# Patient Record
Sex: Male | Born: 1980 | Race: Black or African American | Hispanic: No | Marital: Married | Smoking: Former smoker
Health system: Southern US, Community
[De-identification: ages and names within clinical notes are randomized; demographics above are authoritative.]

## PROBLEM LIST (undated history)

## (undated) DIAGNOSIS — I10 Essential (primary) hypertension: Secondary | ICD-10-CM

## (undated) DIAGNOSIS — N200 Calculus of kidney: Secondary | ICD-10-CM

## (undated) DIAGNOSIS — R3129 Other microscopic hematuria: Secondary | ICD-10-CM

## (undated) HISTORY — PX: NO PAST SURGERIES: SHX2092

## (undated) HISTORY — PX: OTHER SURGICAL HISTORY: SHX169

## (undated) HISTORY — DX: Other microscopic hematuria: R31.29

---

## 2005-02-12 ENCOUNTER — Emergency Department: Payer: Self-pay | Admitting: Emergency Medicine

## 2005-03-25 ENCOUNTER — Emergency Department: Payer: Self-pay | Admitting: Emergency Medicine

## 2007-06-13 IMAGING — CT CT ABD-PELV W/O CM
1 of 2 series · 15 of 32 positions shown, 19 images · non-contrast
Comparison: none

REASON FOR EXAM: Stone study
COMMENTS:

[Series 2: stone · axial · 0.62mm/px · z∈[-528,-148]mm · 15 of 144 slices shown, 19 images]
[im 11/144  soft-tissue]
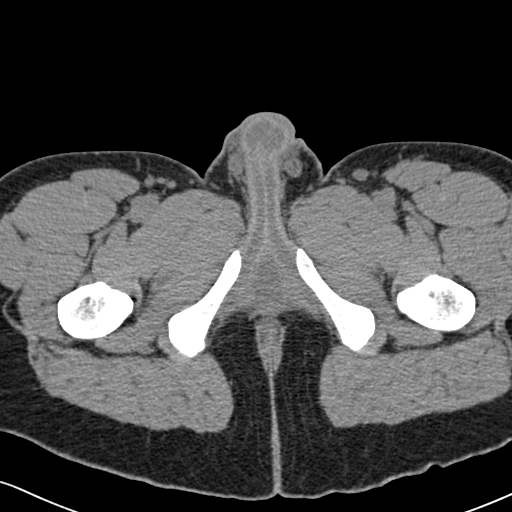
[im 11/144  bone]
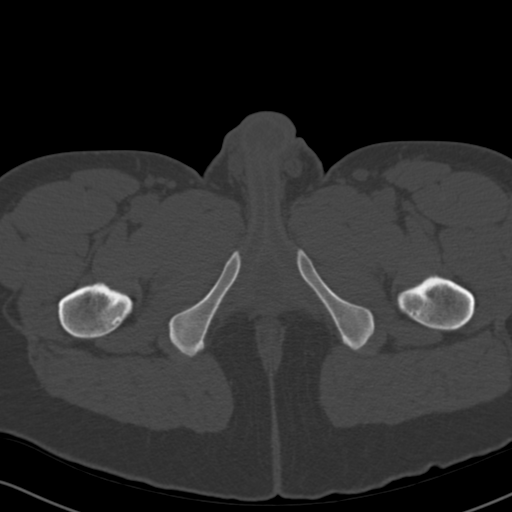
[im 22/144  soft-tissue]
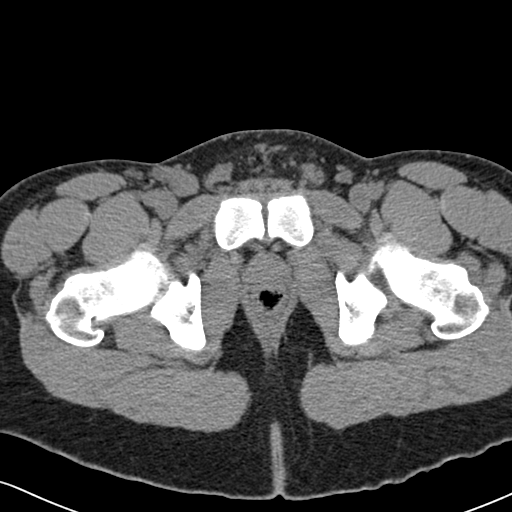
[im 32/144  soft-tissue]
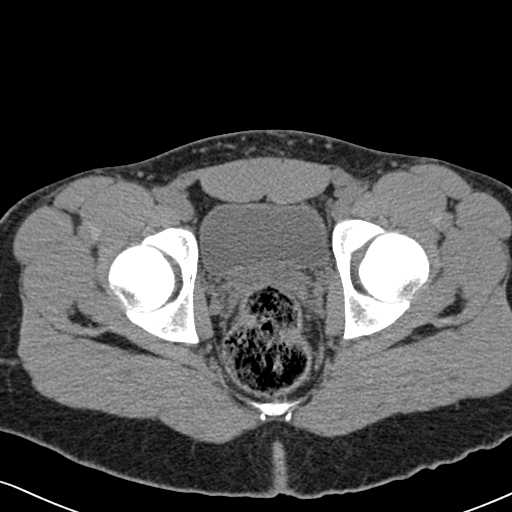
[im 43/144  soft-tissue]
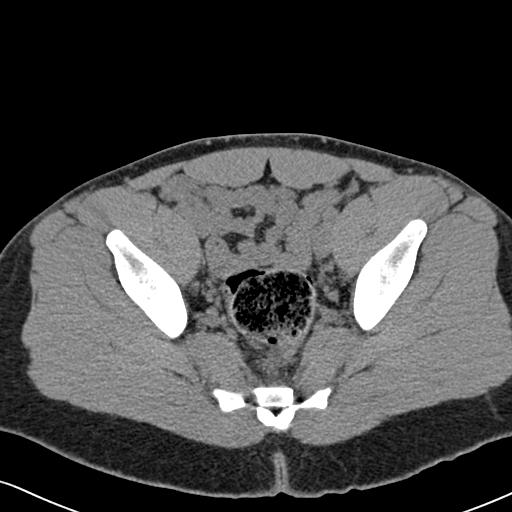
[im 53/144  soft-tissue]
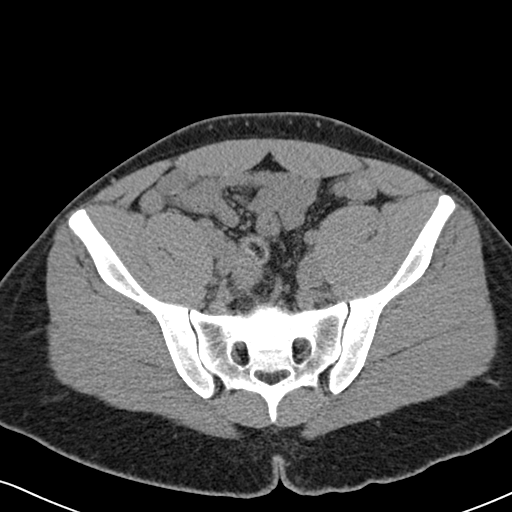
[im 64/144  soft-tissue]
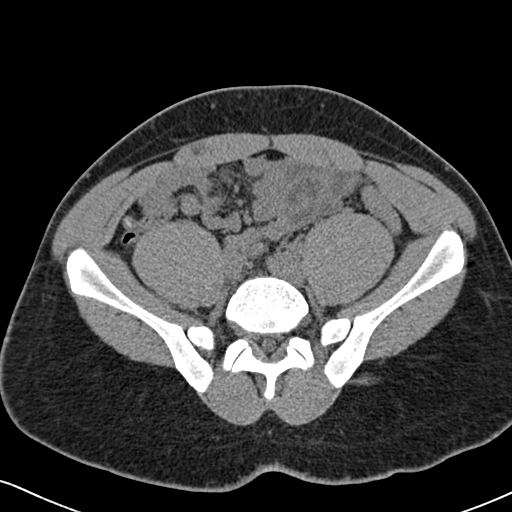
[im 75/144  soft-tissue]
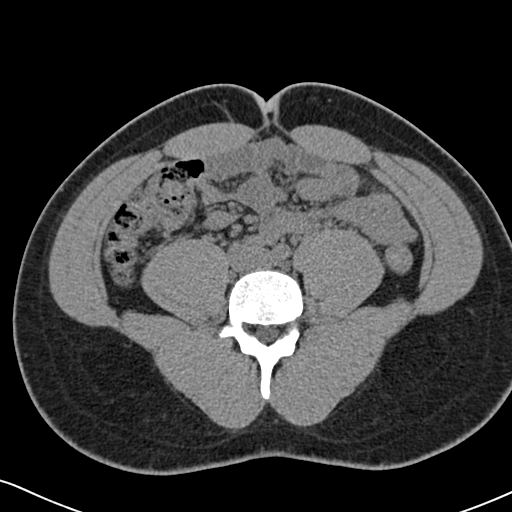
[im 85/144  soft-tissue]
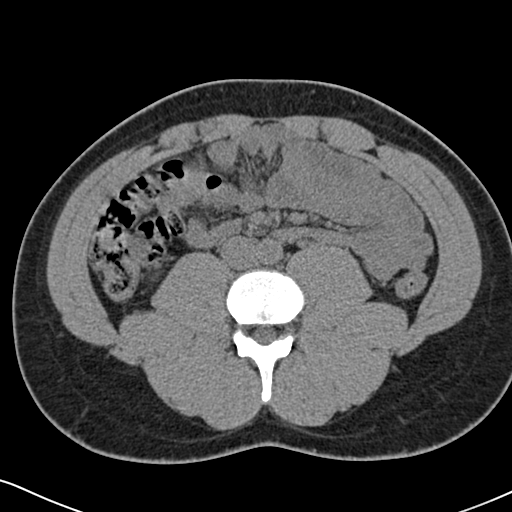
[im 96/144  soft-tissue]
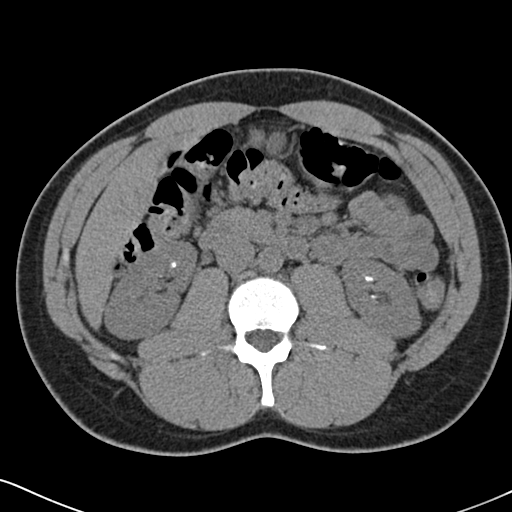
[im 96/144  bone]
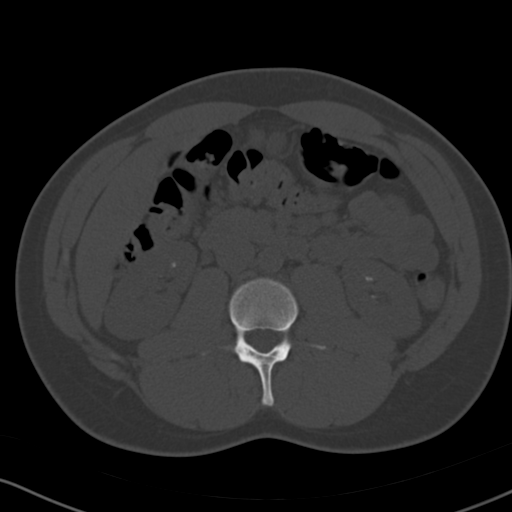
[im 106/144  soft-tissue]
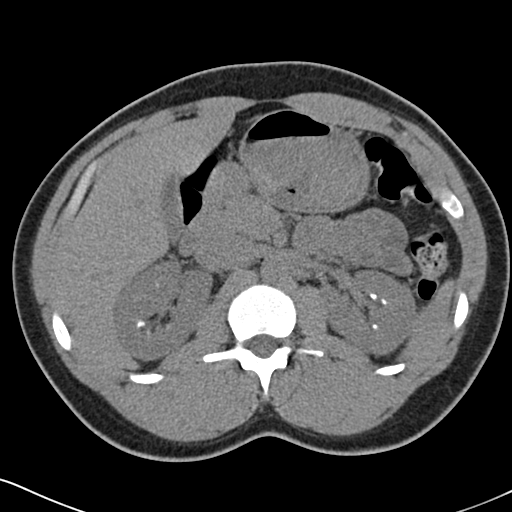
[im 117/144  soft-tissue]
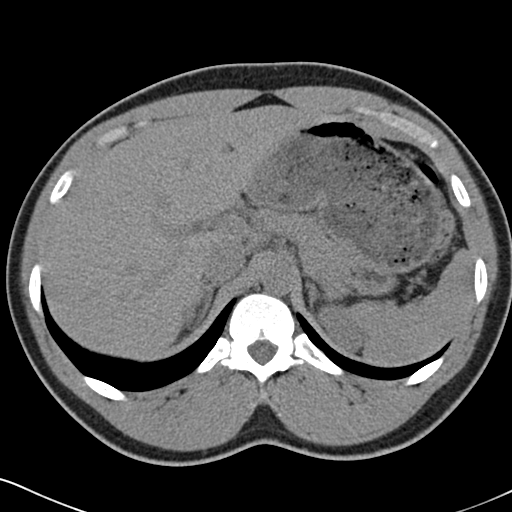
[im 122/144  lung]
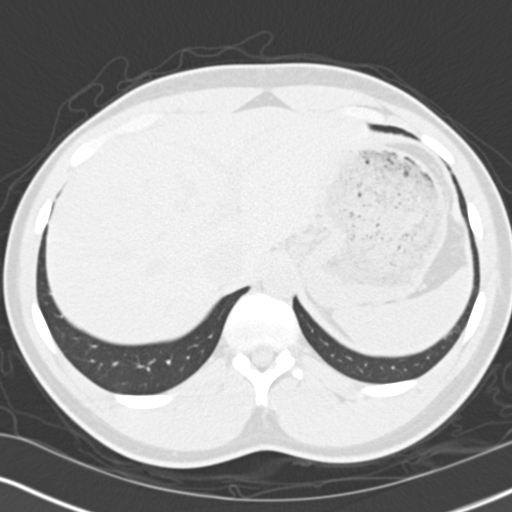
[im 128/144  soft-tissue]
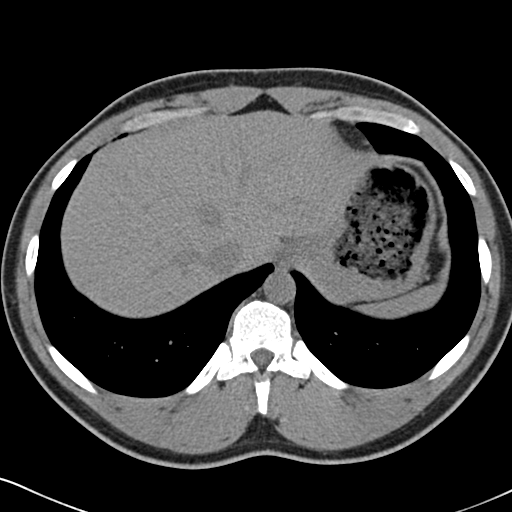
[im 128/144  lung]
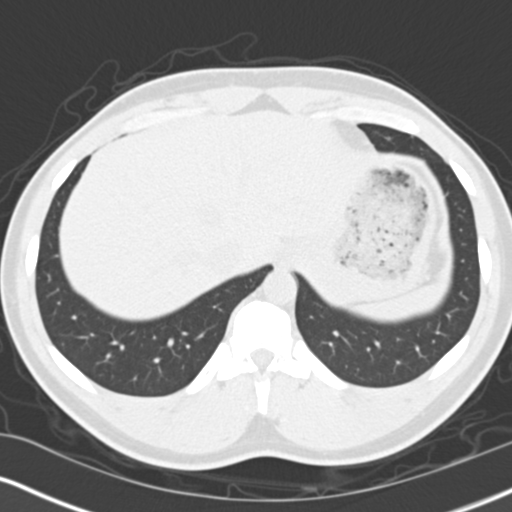
[im 133/144  lung]
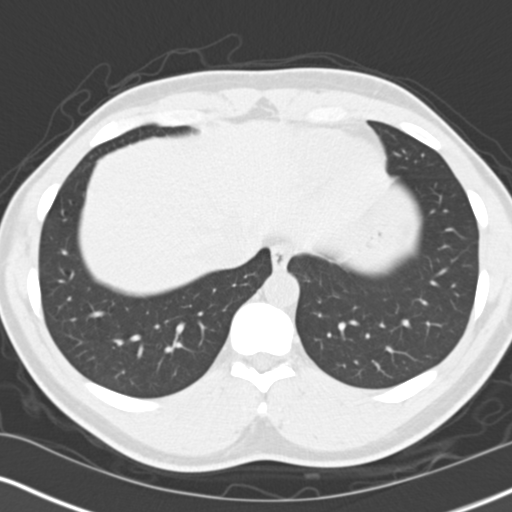
[im 138/144  soft-tissue]
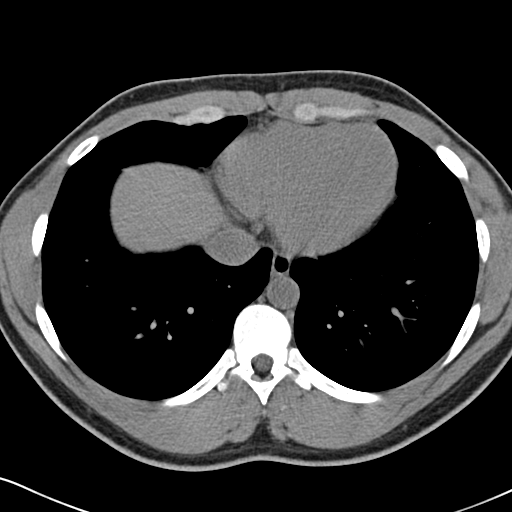
[im 138/144  lung]
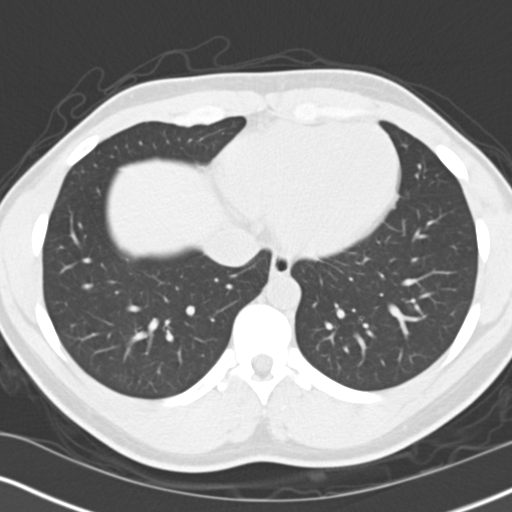

[15 of 32 positions shown; findings below may reference images not displayed]

PROCEDURE:     CT  - CT ABDOMEN AND PELVIS W[DATE] [DATE]

RESULT:     Spiral 3 mm sections were obtained from the lung bases through
the pubic symphysis without the administration of oral or intravenous
contrast.

Evaluation of the lung bases demonstrates no evidence of infiltrates,
effusions, or edema.

Evaluation of the RIGHT and LEFT kidneys demonstrates multiple bilateral
nonobstructing calculi measuring approximately 4 mm in diameter.  There is
no evidence of hydronephrosis, nor hydroureter, nor ureterolithiasis.
Evaluation of the remaining solid abdominal viscera demonstrates no gross
abnormalities.  There is no evidence of abdominal or pelvic free fluid or
drainable loculated fluid collections.  Adenopathy is demonstrated within
the RIGHT and LEFT inguinal regions, with the largest lymph node in the
RIGHT inguinal region measuring approximately 1.2 cm in short axis.
IMPRESSION: 1.     Bilateral nonobstructing nephrolithiasis.
2.     Bilateral inguinal adenopathy.
3.     A  preliminary report was rendered by Dr. Hortencia Kohli, of [REDACTED], on 02/13/2005 at [DATE] a.m. and relayed to Dr. Yemisi
Adonis, of the emergency department, at that time via FAX.

## 2008-10-23 ENCOUNTER — Emergency Department: Payer: Self-pay | Admitting: Emergency Medicine

## 2013-07-22 ENCOUNTER — Emergency Department: Payer: Self-pay | Admitting: Emergency Medicine

## 2015-04-20 DIAGNOSIS — R109 Unspecified abdominal pain: Secondary | ICD-10-CM | POA: Diagnosis present

## 2015-04-20 DIAGNOSIS — N200 Calculus of kidney: Secondary | ICD-10-CM | POA: Insufficient documentation

## 2015-04-21 ENCOUNTER — Encounter: Payer: Self-pay | Admitting: *Deleted

## 2015-04-21 ENCOUNTER — Emergency Department
Admission: EM | Admit: 2015-04-21 | Discharge: 2015-04-21 | Disposition: A | Discharge status: Home / Self Care | Payer: 59 | Attending: Emergency Medicine | Admitting: Emergency Medicine

## 2015-04-21 DIAGNOSIS — N200 Calculus of kidney: Secondary | ICD-10-CM

## 2015-04-21 HISTORY — DX: Calculus of kidney: N20.0

## 2015-04-21 LAB — URINALYSIS COMPLETE WITH MICROSCOPIC (ARMC ONLY)
Bacteria, UA: NONE SEEN
Bilirubin Urine: NEGATIVE
Glucose, UA: NEGATIVE mg/dL
Ketones, ur: NEGATIVE mg/dL
Leukocytes, UA: NEGATIVE
Nitrite: NEGATIVE
Protein, ur: 100 mg/dL — AB
Specific Gravity, Urine: 1.02 (ref 1.005–1.030)
Squamous Epithelial / HPF: NONE SEEN
pH: 6 (ref 5.0–8.0)

## 2015-04-21 LAB — CBC
HCT: 48.2 % (ref 40.0–52.0)
HEMOGLOBIN: 15.8 g/dL (ref 13.0–18.0)
MCH: 30 pg (ref 26.0–34.0)
MCHC: 32.8 g/dL (ref 32.0–36.0)
MCV: 91.4 fL (ref 80.0–100.0)
PLATELETS: 179 10*3/uL (ref 150–440)
RBC: 5.27 MIL/uL (ref 4.40–5.90)
RDW: 13.1 % (ref 11.5–14.5)
WBC: 8.4 10*3/uL (ref 3.8–10.6)

## 2015-04-21 LAB — BASIC METABOLIC PANEL
Anion gap: 7 (ref 5–15)
BUN: 17 mg/dL (ref 6–20)
CHLORIDE: 106 mmol/L (ref 101–111)
CO2: 28 mmol/L (ref 22–32)
Calcium: 9.3 mg/dL (ref 8.9–10.3)
Creatinine, Ser: 1.54 mg/dL — ABNORMAL HIGH (ref 0.61–1.24)
GFR calc Af Amer: >60 mL/min (ref >60)
GFR calc non Af Amer: 58 mL/min — ABNORMAL LOW (ref >60)
GLUCOSE: 119 mg/dL — AB (ref 65–99)
POTASSIUM: 4.6 mmol/L (ref 3.5–5.1)
Sodium: 141 mmol/L (ref 135–145)

## 2015-04-21 MED ORDER — ONDANSETRON HCL 4 MG PO TABS: 4.0000 mg | ORAL_TABLET | Freq: Three times a day (TID) | ORAL | Status: DC | PRN

## 2015-04-21 MED ORDER — TAMSULOSIN HCL 0.4 MG PO CAPS
0.4000 mg | ORAL_CAPSULE | Freq: Every day | ORAL | Status: DC
  Administered 2015-04-21: 0.4 mg via ORAL
  Filled 2015-04-21: qty 1

## 2015-04-21 MED ORDER — TAMSULOSIN HCL 0.4 MG PO CAPS: 0.4000 mg | ORAL_CAPSULE | Freq: Every day | ORAL | Status: DC

## 2015-04-21 MED ORDER — ONDANSETRON HCL 4 MG/2ML IJ SOLN
4.0000 mg | Freq: Once | INTRAMUSCULAR | Status: AC
  Administered 2015-04-21: 4 mg via INTRAVENOUS
  Filled 2015-04-21: qty 2

## 2015-04-21 MED ORDER — FENTANYL CITRATE (PF) 100 MCG/2ML IJ SOLN
50.0000 ug | Freq: Once | INTRAMUSCULAR | Status: AC
  Administered 2015-04-21: 50 ug via INTRAVENOUS
  Filled 2015-04-21: qty 2

## 2015-04-21 MED ORDER — OXYCODONE-ACETAMINOPHEN 5-325 MG PO TABS: 1.0000 | ORAL_TABLET | ORAL | Status: DC | PRN

## 2015-04-21 MED ORDER — IBUPROFEN 600 MG PO TABS
600.0000 mg | ORAL_TABLET | Freq: Once | ORAL | Status: AC
  Administered 2015-04-21: 600 mg via ORAL
  Filled 2015-04-21: qty 1

## 2015-04-21 NOTE — ED Notes (Signed)
Patient with no complaints at this time. Respirations even and unlabored. Skin warm/dry. Discharge instructions reviewed with patient at this time. Patient given opportunity to voice concerns/ask questions. IV removed per policy and band-aid applied to site. Patient discharged at this time and left Emergency Department with steady gait.  

## 2015-04-21 NOTE — ED Notes (Signed)
Pt c/o L flank pain since 1500 yesterday. Pt c/o vomiting x 3 since pain started. Pt has hx of kidney stones but states the pain has never been this intense.

## 2015-04-21 NOTE — Discharge Instructions (Signed)
You're being treated presumptively for kidney stone based on your symptoms and blood in your urine. We discussed no CT at this point time. If you have not passed the stone and are still having symptoms after 1 week, you do need to see a urologist and you're referred to Dr. Berneice Heinrich.  Return to the emergency department for any worsening condition including worsening pain, fever,vomiting and cannot keep medications down, inability to urinate, or any other symptoms concerning to you.   Kidney Stones Kidney stones (urolithiasis) are solid masses that form inside your kidneys. The intense pain is caused by the stone moving through the kidney, ureter, bladder, and urethra (urinary tract). When the stone moves, the ureter starts to spasm around the stone. The stone is usually passed in your pee (urine).  HOME CARE  Drink enough fluids to keep your pee clear or pale yellow. This helps to get the stone out.  Strain all pee through the provided strainer. Do not pee without peeing through the strainer, not even once. If you pee the stone out, catch it in the strainer. The stone may be as small as a grain of salt. Take this to your doctor. This will help your doctor figure out what you can do to try to prevent more kidney stones.  Only take medicine as told by your doctor.  Follow up with your doctor as told.  Get follow-up X-rays as told by your doctor. GET HELP IF: You have pain that gets worse even if you have been taking pain medicine. GET HELP RIGHT AWAY IF:   Your pain does not get better with medicine.  You have a fever or shaking chills.  Your pain increases and gets worse over 18 hours.  You have new belly (abdominal) pain.  You feel faint or pass out.  You are unable to pee. MAKE SURE YOU:   Understand these instructions.  Will watch your condition.  Will get help right away if you are not doing well or get worse. Document Released: 12/31/2007 Document Revised: 03/16/2013 Document  Reviewed: 12/15/2012 [facility] Patient Information 2015 [place], [place]. This information is not intended to replace advice given to you by your health care provider. Make sure you discuss any questions you have with your health care provider.

## 2015-04-21 NOTE — ED Provider Notes (Signed)
[facility] Emergency Department Provider Note   ____________________________________________  Time seen:  I have reviewed the triage vital signs and the triage nursing note.  HISTORY  Chief Complaint Flank Pain   Historian Patient  HPI Cameron Freeman is a 34 y.o. male who has a history of prior kidney stones dating back  To 34 years old. Patient has had several daysof mild symptoms of left-sided flank pain. Acute worsening severe left flank pain with nausea and vomiting since around 3 PM yesterday. Symptoms were more severe than prior kidney stones.pain was severe, at present is mild. He been told previously of these were calcium stones.    Past Medical History  Diagnosis Date  . Kidney stones     There are no active problems to display for this patient.   History reviewed. No pertinent past surgical history.  Current Outpatient Rx  Name  Route  Sig  Dispense  Refill  . ondansetron (ZOFRAN) 4 MG tablet   Oral   Take 1 tablet (4 mg total) by mouth every 8 (eight) hours as needed for nausea or vomiting.   10 tablet   1   . oxyCODONE-acetaminophen (ROXICET) 5-325 MG per tablet   Oral   Take 1 tablet by mouth every 4 (four) hours as needed for severe pain.   10 tablet   0   . tamsulosin (FLOMAX) 0.4 MG CAPS capsule   Oral   Take 1 capsule (0.4 mg total) by mouth daily.   7 capsule   0     Allergies Review of patient's allergies indicates no known allergies.  History reviewed. No pertinent family history.  Social History Social History  Substance Use Topics  . Smoking status: Never Smoker   . Smokeless tobacco: Never Used  . Alcohol Use: Yes    Review of Systems  Constitutional: Negative for fever. Eyes: Negative for visual changes. ENT: Negative for sore throat. Cardiovascular: Negative for chest pain. Respiratory: Negative for shortness of breath. Gastrointestinal: Negative for diarrhea. Genitourinary: Negative for  hematuria Musculoskeletal: Negative for back pain. Skin: Negative for rash. Neurological: Negative for headache. 10 point Review of Systems otherwise negative ____________________________________________   PHYSICAL EXAM:  VITAL SIGNS: ED Triage Vitals  Enc Vitals Group     BP 04/21/15 0022 157/100 mmHg     Pulse Rate 04/21/15 0022 64     Resp 04/21/15 0022 20     Temp 04/21/15 0022 99.6 F (37.6 C)     Temp Source 04/21/15 0022 Oral     SpO2 04/21/15 0022 98 %     Weight 04/21/15 0022 205 lb (92.987 kg)     Height 04/21/15 0022  (1.753 m)     Head Cir --      Peak Flow --      Pain Score 04/21/15 0023 10     Pain Loc --      Pain Edu? --      Excl. in GC? --      Constitutional: Alert and oriented. Well appearing and in no distress. Eyes: Conjunctivae are normal. PERRL. Normal extraocular movements. ENT   Head: Normocephalic and atraumatic.   Nose: No congestion/rhinnorhea.   Mouth/Throat: Mucous membranes are moist.   Neck: No stridor. Cardiovascular/Chest: Normal rate, regular rhythm.  No murmurs, rubs, or gallops. Respiratory: Normal respiratory effort without tachypnea nor retractions. Breath sounds are clear and equal bilaterally. No wheezes/rales/rhonchi. Gastrointestinal: Soft. No distention, no guarding, no rebound. Nontender. No CVA tenderness.  Genitourinary/rectal:Deferred Musculoskeletal: Nontender with normal range of motion in all extremities. No joint effusions.  No lower extremity tenderness.  No edema. Neurologic:  Normal speech and language. No gross or focal neurologic deficits are appreciated. Skin:  Skin is warm, dry and intact. No rash noted. Psychiatric: Mood and affect are normal. Speech and behavior are normal. Patient exhibits appropriate insight and judgment.  ____________________________________________   EKG I, Governor Rooks, MD, the attending physician have personally viewed and interpreted all ECGs.  No EKG  performed ____________________________________________  LABS (pertinent positives/negatives)  Basic metabolic panel significant for creatinine 1.54. BUN is 17. No prior for comparison Urinalysis too numerous to count red blood cells, 6-30 white blood cells, negative leukocytes, negative nitrites, negative bacteria White blood cell count normal at 8.4, hemoglobin 15.8 and platelet count 179  ____________________________________________  RADIOLOGY All Xrays were viewed by me. Imaging interpreted by Radiologist.  none __________________________________________  PROCEDURES  Procedure(s) performed: None  Critical Care performed: None  ____________________________________________   ED COURSE / ASSESSMENT AND PLAN  CONSULTATIONS: None  Pertinent labs & imaging results that were available during my care of the patient were reviewed by me and considered in my medical decision making (see chart for details).   Patient received fentanyl for pain which resolved his pain significantly. No evidence of UTI. Too numerous to count red blood cells consistent with likely ureterolithiasis clinically. I discussed with him not obtaining CT scan due to radiation risk, and treating presumptively as kidney stone. We have no prior creatinine with which to compare, and I have let him know he should have this followed up.   Patient / Family / Caregiver informed of clinical course, medical decision-making process, and agree with plan.   I discussed return precautions, follow-up instructions, and discharged instructions with patient and/or family.  ___________________________________________   FINAL CLINICAL IMPRESSION(S) / ED DIAGNOSES   Final diagnoses:  Kidney stone       Governor Rooks, MD 04/21/15 [id number]

## 2015-04-23 LAB — URINE CULTURE: CULTURE: NO GROWTH

## 2015-08-23 ENCOUNTER — Encounter: Payer: Self-pay | Admitting: Family Medicine

## 2015-08-27 ENCOUNTER — Other Ambulatory Visit: Payer: Self-pay | Admitting: Family Medicine

## 2015-08-27 DIAGNOSIS — N200 Calculus of kidney: Secondary | ICD-10-CM

## 2015-08-30 ENCOUNTER — Encounter: Payer: Self-pay | Admitting: Urology

## 2015-08-30 ENCOUNTER — Other Ambulatory Visit: Payer: Self-pay

## 2015-08-30 ENCOUNTER — Ambulatory Visit (INDEPENDENT_AMBULATORY_CARE_PROVIDER_SITE_OTHER): Payer: 59 | Admitting: Urology

## 2015-08-30 VITALS — BP 151/89 | HR 82 | Ht 69.0 in | Wt 208.0 lb

## 2015-08-30 DIAGNOSIS — R3129 Other microscopic hematuria: Secondary | ICD-10-CM | POA: Diagnosis not present

## 2015-08-30 DIAGNOSIS — Z87442 Personal history of urinary calculi: Secondary | ICD-10-CM | POA: Diagnosis not present

## 2015-08-30 LAB — URINALYSIS, COMPLETE
BILIRUBIN UA: NEGATIVE
GLUCOSE, UA: NEGATIVE
KETONES UA: NEGATIVE
Leukocytes, UA: NEGATIVE
NITRITE UA: NEGATIVE
SPEC GRAV UA: 1.02 (ref 1.005–1.030)
UUROB: 0.2 mg/dL (ref 0.2–1.0)
pH, UA: 5.5 (ref 5.0–7.5)

## 2015-08-30 LAB — MICROSCOPIC EXAMINATION
Bacteria, UA: NONE SEEN
EPITHELIAL CELLS (NON RENAL): NONE SEEN /HPF (ref 0–10)

## 2015-08-30 NOTE — Progress Notes (Signed)
08/30/2015 8:51 PM   Cameron Freeman 10-09-1980 [id number]  Referring provider: [facility] [address] [place], [place] [address]  Chief Complaint  Patient presents with  . Nephrolithiasis    referred by ED    HPI: Patient is a 35 year old African-American male with a long-standing history of nephrolithiasis.  Patient states he has been spontaneously passing stones since he was 73 or 35 years old. He has been managing the stones through the emergency room. He would now like further management and workup of his nephrolithiasis.  He is currently asymptomatic at this time.  He does find that when he passes stones is quite painful. He would like to know how many stones he has at this time. He would also like a workup to try to discover why he forms recurrent stones.  He does not have a family history of kidney stone disease. He is not a huge soda drinker. He has no chronic medical problems.  His UA nose 3-10 RBC's per prior field.  He is not having fevers, chills, nausea or vomiting.   PMH: Past Medical History  Diagnosis Date  . Kidney stones     Surgical History: No past surgical history on file.  Home Medications:    Medication List       This list is accurate as of: 08/30/15 11:59 PM.  Always use your most recent med list.               amoxicillin 875 MG tablet  Commonly known as:  AMOXIL     benzonatate 100 MG capsule  Commonly known as:  TESSALON     ondansetron 4 MG tablet  Commonly known as:  ZOFRAN  Take 1 tablet (4 mg total) by mouth every 8 (eight) hours as needed for nausea or vomiting.     oxyCODONE-acetaminophen 5-325 MG tablet  Commonly known as:  ROXICET  Take 1 tablet by mouth every 4 (four) hours as needed for severe pain.     tamsulosin 0.4 MG Caps capsule  Commonly known as:  FLOMAX  Take 1 capsule (0.4 mg total) by mouth daily.        Allergies: No Known Allergies  Family History: Family  History  Problem Relation Age of Onset  . Kidney disease Maternal Aunt     dialysis  . Prostate cancer Neg Hx   . Lung cancer Father   . Kidney disease Mother     one kidney  . Kidney Stones      Social History:  reports that he has never smoked. He has never used smokeless tobacco. He reports that he drinks alcohol. He reports that he uses illicit drugs (Marijuana).  ROS: UROLOGY Frequent Urination?: No Hard to postpone urination?: No Burning/pain with urination?: No Get up at night to urinate?: No Leakage of urine?: No Urine stream starts and stops?: No Trouble starting stream?: No Do you have to strain to urinate?: No Blood in urine?: No Urinary tract infection?: No Sexually transmitted disease?: No Injury to kidneys or bladder?: No Painful intercourse?: No Weak stream?: No Erection problems?: No Penile pain?: No  Gastrointestinal Nausea?: No Vomiting?: No Indigestion/heartburn?: No Diarrhea?: Yes Constipation?: No  Constitutional Fever: Yes Night sweats?: Yes Weight loss?: No Fatigue?: No  Skin Skin rash/lesions?: No Itching?: No  Eyes Blurred vision?: No Double vision?: No  Ears/Nose/Throat Sore throat?: No Sinus problems?: Yes  Hematologic/Lymphatic Swollen glands?: No Easy bruising?: No  Cardiovascular Leg swelling?: No  Chest pain?: No  Respiratory Cough?: Yes Shortness of breath?: No  Endocrine Excessive thirst?: No  Musculoskeletal Back pain?: Yes Joint pain?: No  Neurological Headaches?: Yes Dizziness?: No  Psychologic Depression?: No Anxiety?: No  Physical Exam: BP 151/89 mmHg  Pulse 82  Ht  (1.753 m)  Wt 208 lb (94.348 kg)  BMI 30.70 kg/m2  Constitutional: Well nourished. Alert and oriented, No acute distress. HEENT: [place] AT, moist mucus membranes. Trachea midline, no masses. Cardiovascular: No clubbing, cyanosis, or edema. Respiratory: Normal respiratory effort, no increased work of breathing. GI: Abdomen  is soft, non tender, non distended, no abdominal masses. Liver and spleen not palpable.  No hernias appreciated.  Stool sample for occult testing is not indicated.   GU: No CVA tenderness.  No bladder fullness or masses.  Patient with circumcised phallus.   Urethral meatus is patent.  No penile discharge. No penile lesions or rashes. Scrotum without lesions, cysts, rashes and/or edema.  Testicles are located scrotally bilaterally. No masses are appreciated in the testicles. Left and right epididymis are normal. Rectal: Patient with  normal sphincter tone. Anus and perineum without scarring or rashes. No rectal masses are appreciated. Prostate is approximately 45 grams, no nodules are appreciated. Seminal vesicles are normal. Skin: No rashes, bruises or suspicious lesions. Lymph: No cervical or inguinal adenopathy. Neurologic: Grossly intact, no focal deficits, moving all 4 extremities. Psychiatric: Normal mood and affect.  Laboratory Data: Lab Results  Component Value Date   WBC 8.4 04/21/2015   HGB 15.8 04/21/2015   HCT 48.2 04/21/2015   MCV 91.4 04/21/2015   PLT 179 04/21/2015    Lab Results  Component Value Date   CREATININE 1.53* 08/30/2015    Urinalysis Results for orders placed or performed in visit on 08/30/15  CULTURE, URINE COMPREHENSIVE  Result Value Ref Range   Urine Culture, Comprehensive Final report    Result 1 Comment   Microscopic Examination  Result Value Ref Range   WBC, UA 0-5 0 -  5 /hpf   RBC, UA 3-10 (A) 0 -  2 /hpf   Epithelial Cells (non renal) None seen 0 - 10 /hpf   Bacteria, UA None seen None seen/Few  Urinalysis, Complete  Result Value Ref Range   Specific Gravity, UA 1.020 1.005 - 1.030   pH, UA 5.5 5.0 - 7.5   Color, UA Yellow Yellow   Appearance Ur Clear Clear   Leukocytes, UA Negative Negative   Protein, UA 2+ (A) Negative/Trace   Glucose, UA Negative Negative   Ketones, UA Negative Negative   RBC, UA Trace (A) Negative   Bilirubin, UA  Negative Negative   Urobilinogen, Ur 0.2 0.2 - 1.0 mg/dL   Nitrite, UA Negative Negative   Microscopic Examination See below:   BUN+Creat  Result Value Ref Range   BUN 16 6 - 20 mg/dL   Creatinine, Ser 4.09 (H) 0.76 - 1.27 mg/dL   GFR calc non Af Amer 59 (L) >59 mL/min/1.73   GFR calc Af Amer 69 >59 mL/min/1.73   BUN/Creatinine Ratio 10 8 - 19     Assessment & Plan:    1. History of kidney stones:   Patient will like the know composition and his metabolic risk factors for stone formation.  We will have the patient undergo metabolic study once his workup for the microscopic hematuria is completed.   2. Microscopic hematuria:   Explained to patient the causes of blood in the urine are as follows: stones, BPH,  UTI's, damage to the urinary tract and/or cancer.  It is explained to the patient that they will be scheduled for a CT Urogram with contrast material and that in rare instances, an allergic reaction can be serious and even life threatening with the injection of contrast material.   The patient denies any allergies to contrast, iodine and/or seafood and is not taking metformin.  - Urinalysis, Complete - CULTURE, URINE COMPREHENSIVE  Return in about 2 weeks (around 09/13/2015) for CT Urogram report.  These notes generated with voice recognition software. I apologize for typographical errors.  Michiel Cowboy, PA-C  [facility] [address] [place], [place] [address] [phone]

## 2015-08-31 LAB — BUN+CREAT
BUN/Creatinine Ratio: 10 (ref 8–19)
BUN: 16 mg/dL (ref 6–20)
CREATININE: 1.53 mg/dL — AB (ref 0.76–1.27)
GFR calc Af Amer: 69 mL/min/{1.73_m2} (ref >59)
GFR calc non Af Amer: 59 mL/min/{1.73_m2} — ABNORMAL LOW (ref >59)

## 2015-09-01 DIAGNOSIS — Z87442 Personal history of urinary calculi: Secondary | ICD-10-CM | POA: Insufficient documentation

## 2015-09-01 DIAGNOSIS — R3129 Other microscopic hematuria: Secondary | ICD-10-CM | POA: Insufficient documentation

## 2015-09-01 LAB — CULTURE, URINE COMPREHENSIVE

## 2015-09-03 ENCOUNTER — Ambulatory Visit: Payer: 59

## 2015-09-06 ENCOUNTER — Ambulatory Visit
Admission: RE | Admit: 2015-09-06 | Discharge: 2015-09-06 | Disposition: A | Discharge status: Home / Self Care | Payer: 59 | Source: Ambulatory Visit | Attending: Family Medicine | Admitting: Family Medicine

## 2015-09-06 DIAGNOSIS — N132 Hydronephrosis with renal and ureteral calculous obstruction: Secondary | ICD-10-CM | POA: Diagnosis not present

## 2015-09-06 DIAGNOSIS — N202 Calculus of kidney with calculus of ureter: Secondary | ICD-10-CM | POA: Insufficient documentation

## 2015-09-06 DIAGNOSIS — R3129 Other microscopic hematuria: Secondary | ICD-10-CM | POA: Insufficient documentation

## 2015-09-06 MED ORDER — IOHEXOL 300 MG/ML  SOLN
150.0000 mL | Freq: Once | INTRAMUSCULAR | Status: AC | PRN
  Administered 2015-09-06: 125 mL via INTRAVENOUS

## 2015-09-13 ENCOUNTER — Ambulatory Visit
Admission: RE | Admit: 2015-09-13 | Discharge: 2015-09-13 | Disposition: A | Discharge status: Home / Self Care | Payer: 59 | Source: Ambulatory Visit | Attending: Urology | Admitting: Urology

## 2015-09-13 ENCOUNTER — Encounter: Payer: Self-pay | Admitting: Urology

## 2015-09-13 ENCOUNTER — Ambulatory Visit (INDEPENDENT_AMBULATORY_CARE_PROVIDER_SITE_OTHER): Payer: 59 | Admitting: Urology

## 2015-09-13 VITALS — BP 146/85 | HR 69 | Ht 66.0 in | Wt 210.2 lb

## 2015-09-13 DIAGNOSIS — N132 Hydronephrosis with renal and ureteral calculous obstruction: Secondary | ICD-10-CM | POA: Diagnosis not present

## 2015-09-13 DIAGNOSIS — Z87442 Personal history of urinary calculi: Secondary | ICD-10-CM | POA: Diagnosis not present

## 2015-09-13 DIAGNOSIS — N201 Calculus of ureter: Secondary | ICD-10-CM

## 2015-09-13 DIAGNOSIS — R3129 Other microscopic hematuria: Secondary | ICD-10-CM | POA: Diagnosis present

## 2015-09-13 DIAGNOSIS — N202 Calculus of kidney with calculus of ureter: Secondary | ICD-10-CM | POA: Insufficient documentation

## 2015-09-13 LAB — URINALYSIS, COMPLETE
BILIRUBIN UA: NEGATIVE
Glucose, UA: NEGATIVE
KETONES UA: NEGATIVE
Leukocytes, UA: NEGATIVE
NITRITE UA: NEGATIVE
SPEC GRAV UA: 1.02 (ref 1.005–1.030)
Urobilinogen, Ur: 0.2 mg/dL (ref 0.2–1.0)
pH, UA: 6 (ref 5.0–7.5)

## 2015-09-13 LAB — MICROSCOPIC EXAMINATION
Bacteria, UA: NONE SEEN
EPITHELIAL CELLS (NON RENAL): NONE SEEN /HPF (ref 0–10)

## 2015-09-13 NOTE — Progress Notes (Signed)
12:35 PM   Cameron Freeman [birth year] [id number]  Referring provider: Darlington Wayland. [place], [place] [address]  Chief Complaint  Patient presents with  . Results    CT    HPI: Patient is a 35 year old African-American male with a long-standing history of nephrolithiasis, who was found to have microscopic hematuria presents today to discuss his CT urogram results.  Previous history Patient states he has been spontaneously passing stones since he was 81 or 35 years old. He has been managing the stones through the emergency room. He would now like further management and workup of his nephrolithiasis.  He is currently asymptomatic at this time.  He does find that when he passes stones is quite painful. He would like to know how many stones he has at this time. He would also like a workup to try to discover why he forms recurrent stones.  He does not have a family history of kidney stone disease. He is not a huge soda drinker. He has no chronic medical problems.  Today, he states he's had some intermittent left-sided flank pain.  He has not had any gross hematuria. His UA today is unremarkable.  He has not had fevers, chills, nausea or vomiting.  His CT urogram noted 6 mm distal left ureteral calculus with mild left hydroureteronephrosis. Bilateral nephrolithiasis. No radiographic evidence of urinary tract carcinoma.  This was completed on 09/06/2015.  I have reviewed the films with the patient.   PMH: Past Medical History  Diagnosis Date  . Kidney stones   . Microscopic hematuria     Surgical History: Past Surgical History  Procedure Laterality Date  . None      Home Medications:    Medication List       This list is accurate as of: 09/13/15 12:35 PM.  Always use your most recent med list.               amoxicillin 875 MG tablet  Commonly known as:  AMOXIL  Reported on 09/13/2015     benzonatate 100 MG capsule    Commonly known as:  TESSALON  Reported on 09/13/2015     ondansetron 4 MG tablet  Commonly known as:  ZOFRAN  Take 1 tablet (4 mg total) by mouth every 8 (eight) hours as needed for nausea or vomiting.     oxyCODONE-acetaminophen 5-325 MG tablet  Commonly known as:  ROXICET  Take 1 tablet by mouth every 4 (four) hours as needed for severe pain.     tamsulosin 0.4 MG Caps capsule  Commonly known as:  FLOMAX  Take 1 capsule (0.4 mg total) by mouth daily.        Allergies: No Known Allergies  Family History: Family History  Problem Relation Age of Onset  . Kidney disease Maternal Aunt     dialysis  . Prostate cancer Neg Hx   . Lung cancer Father   . Kidney disease Mother     one kidney  . Kidney Stones      Social History:  reports that he has never smoked. He has never used smokeless tobacco. He reports that he drinks alcohol. He reports that he uses illicit drugs (Marijuana).  ROS: UROLOGY Frequent Urination?: No Hard to postpone urination?: No Burning/pain with urination?: No Get up at night to urinate?: No Leakage of urine?: No Urine stream starts and stops?: No Trouble starting stream?: No Do you have to strain to urinate?:  No Blood in urine?: No Urinary tract infection?: No Sexually transmitted disease?: No Injury to kidneys or bladder?: No Painful intercourse?: No Weak stream?: No Erection problems?: No Penile pain?: No  Gastrointestinal Nausea?: No Vomiting?: No Indigestion/heartburn?: No Diarrhea?: No Constipation?: No  Constitutional Fever: No Night sweats?: No Weight loss?: No Fatigue?: No  Skin Skin rash/lesions?: No Itching?: No  Eyes Blurred vision?: No Double vision?: No  Ears/Nose/Throat Sore throat?: No Sinus problems?: No  Hematologic/Lymphatic Swollen glands?: No Easy bruising?: No  Cardiovascular Leg swelling?: No Chest pain?: No  Respiratory Cough?: No Shortness of breath?: No  Endocrine Excessive thirst?:  No  Musculoskeletal Back pain?: No Joint pain?: No  Neurological Headaches?: No Dizziness?: No  Psychologic Depression?: No Anxiety?: No  Physical Exam: BP 146/85 mmHg  Pulse 69  Ht _0  (1.676 m)  Wt 210 lb 3.2 oz (95.346 kg)  BMI 33.94 kg/m2  Constitutional: Well nourished. Alert and oriented, No acute distress. HEENT: [place] AT, moist mucus membranes. Trachea midline, no masses. Cardiovascular: No clubbing, cyanosis, or edema. Respiratory: Normal respiratory effort, no increased work of breathing. GI: Abdomen is soft, non tender, non distended, no abdominal masses. Liver and spleen not palpable.  No hernias appreciated.  Stool sample for occult testing is not indicated.   GU: No CVA tenderness.  No bladder fullness or masses.   Skin: No rashes, bruises or suspicious lesions. Lymph: No cervical or inguinal adenopathy. Neurologic: Grossly intact, no focal deficits, moving all 4 extremities. Psychiatric: Normal mood and affect.  Laboratory Data: Lab Results  Component Value Date   WBC 8.4 04/21/2015   HGB 15.8 04/21/2015   HCT 48.2 04/21/2015   MCV 91.4 04/21/2015   PLT 179 04/21/2015    Lab Results  Component Value Date   CREATININE 1.53* 08/30/2015    Urinalysis Results for orders placed or performed in visit on 09/13/15  Microscopic Examination  Result Value Ref Range   WBC, UA 0-5 0 -  5 /hpf   RBC, UA 0-2 0 -  2 /hpf   Epithelial Cells (non renal) None seen 0 - 10 /hpf   Mucus, UA Present (A) Not Estab.   Bacteria, UA None seen None seen/Few  Urinalysis, Complete  Result Value Ref Range   Specific Gravity, UA 1.020 1.005 - 1.030   pH, UA 6.0 5.0 - 7.5   Color, UA Yellow Yellow   Appearance Ur Clear Clear   Leukocytes, UA Negative Negative   Protein, UA Trace (A) Negative/Trace   Glucose, UA Negative Negative   Ketones, UA Negative Negative   RBC, UA Trace (A) Negative   Bilirubin, UA Negative Negative   Urobilinogen, Ur 0.2 0.2 - 1.0 mg/dL    Nitrite, UA Negative Negative   Microscopic Examination See below:    Pertinent imaging CLINICAL DATA: Microscopic hematuria. Nephrolithiasis.  EXAM: CT ABDOMEN AND PELVIS WITHOUT AND WITH CONTRAST  TECHNIQUE: Multidetector CT imaging of the abdomen and pelvis was performed following the standard protocol before and following the bolus administration of intravenous contrast.  CONTRAST: 158m OMNIPAQUE IOHEXOL 300 MG/ML SOLN  COMPARISON: 02/13/2005  FINDINGS: Lower chest: No acute findings.  Hepatobiliary: Tiny sub-cm cyst seen in the posterior right hepatic lobe. No liver masses are identified. Gallbladder is unremarkable.  Pancreas: No mass, inflammatory changes, or other significant abnormality.  Spleen: Within normal limits in size and appearance.  Adrenals/Urinary Tract: No adrenal masses are identified. Multiple small less than 1 cm intrarenal calculi are again seen bilaterally. Mild left  hydronephrosis and ureterectasis is seen. A 6 mm distal left ureteral calculus is seen which is new since previous study. No other ureteral calculi identified.  A tiny sub-cm left renal cysts is noted, however no renal masses are identified. No masses are seen involving the lower urinary tract or bladder.  Stomach/Bowel: No evidence of obstruction, inflammatory process, or abnormal fluid collections.  Vascular/Lymphatic: No pathologically enlarged lymph nodes. No evidence of abdominal aortic aneurysm.  Reproductive: No mass or other significant abnormality.  Other: None.  Musculoskeletal: No suspicious bone lesions identified.  IMPRESSION: 6 mm distal left ureteral calculus with mild left hydroureteronephrosis.  Bilateral nephrolithiasis.  No radiographic evidence of urinary tract carcinoma.   Electronically Signed  By: Earle Gell M.D.  On: 09/06/2015 14:13  CLINICAL DATA: Renal calculi. Plan for lithotripsy.  EXAM: ABDOMEN - 1  VIEW   COMPARISON: CT 09/06/2015  FINDINGS: An 8 mm stone in the left hemipelvis correlates distal left ureteral stone seen on comparison CT. There are numerous calculi over the bilateral renal shadows, right more than left, underestimated compared to prior CT.  Nonobstructive bowel gas pattern. Lung bases are clear. No concerning intra-abdominal mass effect.  IMPRESSION: 1. 8 mm distal left ureteral stone in unchanged position compared to 09/06/2015 CT. 2. Extensive, right worse than left, bilateral nephrolithiasis.   Electronically Signed  By: Monte Fantasia M.D.  On: 09/13/2015 13:50  Assessment & Plan:    1. Left distal ureteral stone:   I discussed with the patient the treatment for ureteral stones, such as MET, ESWL and URS/LL/stent placement.  I discussed the success rate of each treatment options, how each option is performed and the risks factors of each.  He is leaning more towards ESWL, but he would like to discuss it further with his mother.  His left distal ureteral stone is < 1300 HU and he skin to stone surface is < 15 cm.  the stone is visible on KUB.  I did explain to the patient that we will only be able to treat the left distal ureteral stone during ESWL.  I also stated that if the stone fragments would not pass on their own or cause obstruction,  he may need a stent placed after ESWL.  We did discuss "stent pain."  I emphasized that he will need several procedures to rid himself of his stone burden.  He voiced his understanding and wishes to be scheduled for a left ESWL for the left distal ureteral stone.  - Urinalysis, Complete - CULTURE, URINE COMPREHENSIVE  2. Left hydronephrosis:   Patient will undergo a renal ultrasound after definitive treatment for the left distal ureteral stone to ensure the hydronephrosis has resolved.  3. Microscopic hematuria:   We will continue to monitor to ensure the hematuria resolves once he has received definitive  treatment for his stones.  If in the future, he does undergo a ureteroscopic procedure for definitive stone treatment we can complete a cystoscopic exam at that time.  4. History of kidney stones:   Patient will like the know composition and his metabolic risk factors for stone formation.  We will have the patient undergo metabolic study once he has had definitive treatment for his stones.  Return for schedule ESWL.  These notes generated with voice recognition software. I apologize for typographical errors.  Zara Council, [facility] [address], [place], [place] [address] [phone]

## 2015-09-15 LAB — CULTURE, URINE COMPREHENSIVE

## 2015-09-20 ENCOUNTER — Encounter
Admission: RE | Disposition: A | Discharge status: Home / Self Care | Payer: Self-pay | Source: Ambulatory Visit | Attending: Urology

## 2015-09-20 ENCOUNTER — Encounter: Payer: Self-pay | Admitting: *Deleted

## 2015-09-20 ENCOUNTER — Ambulatory Visit
Admission: RE | Admit: 2015-09-20 | Discharge: 2015-09-20 | Disposition: A | Discharge status: Home / Self Care | Payer: 59 | Source: Ambulatory Visit | Attending: Urology | Admitting: Urology

## 2015-09-20 ENCOUNTER — Ambulatory Visit: Payer: 59

## 2015-09-20 DIAGNOSIS — Z87442 Personal history of urinary calculi: Secondary | ICD-10-CM | POA: Insufficient documentation

## 2015-09-20 DIAGNOSIS — N201 Calculus of ureter: Secondary | ICD-10-CM | POA: Diagnosis present

## 2015-09-20 DIAGNOSIS — N200 Calculus of kidney: Secondary | ICD-10-CM

## 2015-09-20 HISTORY — PX: EXTRACORPOREAL SHOCK WAVE LITHOTRIPSY: SHX1557

## 2015-09-20 SURGERY — LITHOTRIPSY, ESWL
Anesthesia: Moderate Sedation | Laterality: Left

## 2015-09-20 MED ORDER — DEXTROSE-NACL 5-0.45 % IV SOLN
INTRAVENOUS | Status: DC
  Administered 2015-09-20: 12:00:00 via INTRAVENOUS

## 2015-09-20 MED ORDER — DIPHENHYDRAMINE HCL 25 MG PO CAPS
ORAL_CAPSULE | ORAL | Status: AC
  Administered 2015-09-20: 25 mg via ORAL
  Filled 2015-09-20: qty 1

## 2015-09-20 MED ORDER — DIAZEPAM 5 MG PO TABS
ORAL_TABLET | ORAL | Status: AC
  Administered 2015-09-20: 10 mg via ORAL
  Filled 2015-09-20: qty 2

## 2015-09-20 MED ORDER — CIPROFLOXACIN HCL 500 MG PO TABS
500.0000 mg | ORAL_TABLET | Freq: Once | ORAL | Status: AC
  Administered 2015-09-20: 500 mg via ORAL

## 2015-09-20 MED ORDER — DIPHENHYDRAMINE HCL 25 MG PO CAPS
25.0000 mg | ORAL_CAPSULE | ORAL | Status: AC
  Administered 2015-09-20: 25 mg via ORAL

## 2015-09-20 MED ORDER — CIPROFLOXACIN HCL 500 MG PO TABS
ORAL_TABLET | ORAL | Status: AC
  Administered 2015-09-20: 500 mg via ORAL
  Filled 2015-09-20: qty 1

## 2015-09-20 MED ORDER — DIAZEPAM 5 MG PO TABS
10.0000 mg | ORAL_TABLET | ORAL | Status: AC
  Administered 2015-09-20: 10 mg via ORAL

## 2015-09-20 NOTE — OR Nursing (Signed)
Dr Sherryl Barters contacted about BP 152/108. States pt OK to be discharged. No new orders.

## 2015-09-21 ENCOUNTER — Other Ambulatory Visit: Payer: Self-pay | Admitting: Urology

## 2015-09-21 ENCOUNTER — Telehealth: Payer: Self-pay | Admitting: Radiology

## 2015-09-21 DIAGNOSIS — N200 Calculus of kidney: Secondary | ICD-10-CM

## 2015-09-21 NOTE — Telephone Encounter (Signed)
Unable to leave message. Pt needs 1 wk f/u after ESWL on 09/20/15 with KUB prior. Order is in EPIC.

## 2015-09-24 NOTE — Telephone Encounter (Signed)
Unable to LM

## 2015-09-27 ENCOUNTER — Ambulatory Visit
Admission: RE | Admit: 2015-09-27 | Discharge: 2015-09-27 | Disposition: A | Discharge status: Home / Self Care | Payer: 59 | Source: Ambulatory Visit | Attending: Urology | Admitting: Urology

## 2015-09-27 ENCOUNTER — Ambulatory Visit (INDEPENDENT_AMBULATORY_CARE_PROVIDER_SITE_OTHER): Payer: 59 | Admitting: Urology

## 2015-09-27 ENCOUNTER — Encounter: Payer: Self-pay | Admitting: Urology

## 2015-09-27 VITALS — BP 128/85 | HR 64 | Ht 69.0 in | Wt 208.4 lb

## 2015-09-27 DIAGNOSIS — N200 Calculus of kidney: Secondary | ICD-10-CM

## 2015-09-27 NOTE — Progress Notes (Signed)
09/27/2015 12:15 PM   Codee Gagan 1981-01-20 [id number]  Referring provider: [facility] [address] [place], [place] [address]  No chief complaint on file.   HPI: The patient is a 35 year old gentleman with a past metal history significant for recurrent nephrolithiasis who presents after undergoing lithotripsy for distal 6 mm left ureteral stone. He had a repeat KUB that showed stone in relatively the same position that seems to have increased in size consistent with spreading out from lithotripsy. The patient has passed some small fragments. He is in no pain. He stopped his Flomax due to dizziness. He reports he is able to play possible work without any problems despite the stone in his left ureter.  Previous history Patient states he has been spontaneously passing stones since he was 39 or 35 years old. He has been managing the stones through the emergency room. He would now like further management and workup of his nephrolithiasis. He is currently asymptomatic at this time. He does find that when he passes stones is quite painful. He would like to know how many stones he has at this time. He would also like a workup to try to discover why he forms recurrent stones. He does not have a family history of kidney stone disease. He is not a huge soda drinker. He has no chronic medical problems.  Today, he states he's had some intermittent left-sided flank pain. He has not had any gross hematuria. His UA today is unremarkable. He has not had fevers, chills, nausea or vomiting. His CT urogram noted 6 mm distal left ureteral calculus with mild left hydroureteronephrosis. Bilateral nephrolithiasis. No radiographic evidence of urinary tract carcinoma. This was completed on 09/06/2015. I have reviewed the films with the patient.   PMH: Past Medical History  Diagnosis Date  . Kidney stones   . Microscopic hematuria     Surgical History: Past  Surgical History  Procedure Laterality Date  . None    . No past surgeries    . Extracorporeal shock wave lithotripsy Left 09/20/2015    Procedure: EXTRACORPOREAL SHOCK WAVE LITHOTRIPSY (ESWL);  Surgeon: Hildred Laser, MD;  Location: [facility] ORS;  Service: Urology;  Laterality: Left;    Home Medications:    Medication List       This list is accurate as of: 09/27/15 12:15 PM.  Always use your most recent med list.               amoxicillin 875 MG tablet  Commonly known as:  AMOXIL  Reported on 09/27/2015     benzonatate 100 MG capsule  Commonly known as:  TESSALON  Reported on 09/27/2015     ondansetron 4 MG tablet  Commonly known as:  ZOFRAN  Take 1 tablet (4 mg total) by mouth every 8 (eight) hours as needed for nausea or vomiting.     oxyCODONE-acetaminophen 5-325 MG tablet  Commonly known as:  ROXICET  Take 1 tablet by mouth every 4 (four) hours as needed for severe pain.     tamsulosin 0.4 MG Caps capsule  Commonly known as:  FLOMAX  Take 1 capsule (0.4 mg total) by mouth daily.        Allergies: No Known Allergies  Family History: Family History  Problem Relation Age of Onset  . Kidney disease Maternal Aunt     dialysis  . Prostate cancer Neg Hx   . Lung cancer Father   . Kidney disease Mother  one kidney  . Kidney Stones      Social History:  reports that he has never smoked. He has never used smokeless tobacco. He reports that he drinks alcohol. He reports that he uses illicit drugs (Marijuana).  ROS: UROLOGY Frequent Urination?: No Hard to postpone urination?: No Burning/pain with urination?: No Get up at night to urinate?: No Leakage of urine?: No Urine stream starts and stops?: No Trouble starting stream?: No Do you have to strain to urinate?: No Blood in urine?: No Urinary tract infection?: No Sexually transmitted disease?: No Injury to kidneys or bladder?: No Painful intercourse?: No Weak stream?: No Erection problems?: No Penile  pain?: No  Gastrointestinal Nausea?: No Vomiting?: No Indigestion/heartburn?: No Diarrhea?: No Constipation?: No  Constitutional Fever: No Night sweats?: No Weight loss?: No Fatigue?: No  Skin Skin rash/lesions?: No Itching?: No  Eyes Blurred vision?: No Double vision?: No  Ears/Nose/Throat Sore throat?: No Sinus problems?: No  Hematologic/Lymphatic Swollen glands?: No Easy bruising?: No  Cardiovascular Leg swelling?: No Chest pain?: No  Respiratory Cough?: No Shortness of breath?: No  Endocrine Excessive thirst?: No  Musculoskeletal Back pain?: No Joint pain?: No  Neurological Headaches?: No Dizziness?: No  Psychologic Depression?: No Anxiety?: No  Physical Exam: BP 128/85 mmHg  Pulse 64  Ht  (1.753 m)  Wt 208 lb 6.4 oz (94.53 kg)  BMI 30.76 kg/m2  Constitutional:  Alert and oriented, No acute distress. HEENT: [place] AT, moist mucus membranes.  Trachea midline, no masses. Cardiovascular: No clubbing, cyanosis, or edema. Respiratory: Normal respiratory effort, no increased work of breathing. GI: Abdomen is soft, nontender, nondistended, no abdominal masses GU: No CVA tenderness.  Skin: No rashes, bruises or suspicious lesions. Lymph: No cervical or inguinal adenopathy. Neurologic: Grossly intact, no focal deficits, moving all 4 extremities. Psychiatric: Normal mood and affect.  Laboratory Data: Lab Results  Component Value Date   WBC 8.4 04/21/2015   HGB 15.8 04/21/2015   HCT 48.2 04/21/2015   MCV 91.4 04/21/2015   PLT 179 04/21/2015    Lab Results  Component Value Date   CREATININE 1.53* 08/30/2015    No results found for: PSA  No results found for: TESTOSTERONE  No results found for: HGBA1C  Urinalysis    Component Value Date/Time   COLORURINE YELLOW* 04/21/2015 0048   APPEARANCEUR HAZY* 04/21/2015 0048   LABSPEC 1.020 04/21/2015 0048   PHURINE 6.0 04/21/2015 0048   GLUCOSEU Negative 09/13/2015 1124   HGBUR 3+*  04/21/2015 0048   BILIRUBINUR Negative 09/13/2015 1124   BILIRUBINUR NEGATIVE 04/21/2015 0048   KETONESUR NEGATIVE 04/21/2015 0048   PROTEINUR 100* 04/21/2015 0048   NITRITE Negative 09/13/2015 1124   NITRITE NEGATIVE 04/21/2015 0048   LEUKOCYTESUR Negative 09/13/2015 1124   LEUKOCYTESUR NEGATIVE 04/21/2015 0048    Pertinent Imaging: On my review of today's KUB, the stone appears to change in terms of spreading out what is in the same location.  Assessment & Plan:    1. Left distal ureteral stone s/p ESWL The stone is clearly changed configuration from his previous KUB and as well as seen during lithotripsy. He is only passed some small fragments in the stone seems to be mostly still has distal left ureter. He states that by this time, so we will give him more time to pass the stone. He did not tolerate Flomax. He is not taking pain medication. He will follow-up in one month with a KUB. He is advised to drink at least 64 ounces of  water per day.  2. Recurrent nephrolithiasis We'll consider 24 hour urine collection after stones up in clinic next  3. Bilateral nephrolithiasis with largest stone burden on the lright We'll address this after he has cleared his left ureteral stone.   Return in about 4 weeks (around 10/25/2015) for with KUB prior.  Hildred Laser, MD  [facility], [facility] [address] [place], [place] [address] [phone]

## 2015-11-01 ENCOUNTER — Ambulatory Visit
Admission: RE | Admit: 2015-11-01 | Discharge: 2015-11-01 | Disposition: A | Discharge status: Home / Self Care | Payer: 59 | Source: Ambulatory Visit | Attending: Urology | Admitting: Urology

## 2015-11-01 ENCOUNTER — Encounter: Payer: Self-pay | Admitting: Urology

## 2015-11-01 ENCOUNTER — Ambulatory Visit (INDEPENDENT_AMBULATORY_CARE_PROVIDER_SITE_OTHER): Payer: 59 | Admitting: Urology

## 2015-11-01 VITALS — BP 110/70 | HR 71 | Ht 69.0 in | Wt 215.9 lb

## 2015-11-01 DIAGNOSIS — N200 Calculus of kidney: Secondary | ICD-10-CM

## 2015-11-01 DIAGNOSIS — N132 Hydronephrosis with renal and ureteral calculous obstruction: Secondary | ICD-10-CM

## 2015-11-01 DIAGNOSIS — N201 Calculus of ureter: Secondary | ICD-10-CM | POA: Diagnosis not present

## 2015-11-01 LAB — MICROSCOPIC EXAMINATION
Bacteria, UA: NONE SEEN
EPITHELIAL CELLS (NON RENAL): NONE SEEN /HPF (ref 0–10)
WBC, UA: NONE SEEN /hpf (ref 0–?)

## 2015-11-01 LAB — URINALYSIS, COMPLETE
BILIRUBIN UA: NEGATIVE
GLUCOSE, UA: NEGATIVE
KETONES UA: NEGATIVE
LEUKOCYTES UA: NEGATIVE
NITRITE UA: NEGATIVE
SPEC GRAV UA: 1.02 (ref 1.005–1.030)
Urobilinogen, Ur: 0.2 mg/dL (ref 0.2–1.0)
pH, UA: 5.5 (ref 5.0–7.5)

## 2015-11-01 NOTE — Progress Notes (Signed)
11/01/2015 11:36 AM   Cameron Freeman 07/13/1981 [id number]  Referring provider: [facility]harles [facility] [address] [place], [place] [address]  Chief Complaint  Patient presents with  . Follow-up    Left distal ureteral stone s/p ESWL    HPI: The patient is a 35 year old gentleman with a past metal history significant for recurrent nephrolithiasis who presents after undergoing lithotripsy for distal 6 mm left ureteral stone On 09/20/2015, he had a KUB done after procedure which showed changes in size but still persists the stone. He returns today with a KUB showing a slight decrease in the stone size stone in a relatively same location in the distal ureter.  Previous history Patient states he has been spontaneously passing stones since he was [age] or 35 years old. He has been managing the stones through the emergency room. He would now like further management and workup of his nephrolithiasis. He is currently asymptomatic at this time. He does find that when he passes stones is quite painful. He would like to know how many stones he has at this time. He would also like a workup to try to discover why he forms recurrent stones. He does not have a family history of kidney stone disease. He is not a huge soda drinker. He has no chronic medical problems.  Today, he states he's had some intermittent left-sided flank pain. He has not had any gross hematuria. His UA today is unremarkable. He has not had fevers, chills, nausea or vomiting. His CT urogram noted 6 mm distal left ureteral calculus with mild left hydroureteronephrosis. Bilateral nephrolithiasis. No radiographic evidence of urinary tract carcinoma. This was completed on 09/06/2015. I have reviewed the films with the patient.   PMH: Past Medical History  Diagnosis Date  . Kidney stones   . Microscopic hematuria     Surgical History: Past Surgical History  Procedure Laterality Date  . None    .  No past surgeries    . Extracorporeal shock wave lithotripsy Left 09/20/2015    Procedure: EXTRACORPOREAL SHOCK WAVE LITHOTRIPSY (ESWL);  Surgeon: Hildred LaserBrian James Kabrea Seeney, MD;  Location: [facility] ORS;  Service: Urology;  Laterality: Left;    Home Medications:    Medication List       This list is accurate as of: 11/01/15 11:36 AM.  Always use your most recent med list.               amoxicillin 875 MG tablet  Commonly known as:  AMOXIL  Reported on 11/01/2015     benzonatate 100 MG capsule  Commonly known as:  TESSALON  Reported on 11/01/2015     ondansetron 4 MG tablet  Commonly known as:  ZOFRAN  Take 1 tablet (4 mg total) by mouth every 8 (eight) hours as needed for nausea or vomiting.     oxyCODONE-acetaminophen 5-325 MG tablet  Commonly known as:  ROXICET  Take 1 tablet by mouth every 4 (four) hours as needed for severe pain.     tamsulosin 0.4 MG Caps capsule  Commonly known as:  FLOMAX  Take 1 capsule (0.4 mg total) by mouth daily.        Allergies: No Known Allergies  Family History: Family History  Problem Relation Age of Onset  . Kidney disease Maternal Aunt     dialysis  . Prostate cancer Neg Hx   . Lung cancer Father   . Kidney disease Mother     one kidney  . Kidney  Stones      Social History:  reports that he has never smoked. He has never used smokeless tobacco. He reports that he drinks alcohol. He reports that he uses illicit drugs (Marijuana).  ROS: UROLOGY Frequent Urination?: No Hard to postpone urination?: No Burning/pain with urination?: No Get up at night to urinate?: No Leakage of urine?: No Urine stream starts and stops?: No Trouble starting stream?: No Do you have to strain to urinate?: No Blood in urine?: No Urinary tract infection?: No Sexually transmitted disease?: No Injury to kidneys or bladder?: No Painful intercourse?: No Weak stream?: No Erection problems?: No Penile pain?: No  Gastrointestinal Nausea?: No Vomiting?:  No Indigestion/heartburn?: No Diarrhea?: No Constipation?: No  Constitutional Fever: No Night sweats?: No Weight loss?: No Fatigue?: No  Skin Skin rash/lesions?: No Itching?: No  Eyes Blurred vision?: No Double vision?: No  Ears/Nose/Throat Sore throat?: No Sinus problems?: No  Hematologic/Lymphatic Swollen glands?: No Easy bruising?: No  Cardiovascular Leg swelling?: No Chest pain?: No  Respiratory Cough?: No Shortness of breath?: No  Endocrine Excessive thirst?: No  Musculoskeletal Back pain?: No Joint pain?: No  Neurological Headaches?: No Dizziness?: No  Psychologic Depression?: No Anxiety?: No  Physical Exam: BP 110/70 mmHg  Pulse 71  Ht  (1.753 m)  Wt 215 lb 14.4 oz (97.932 kg)  BMI 31.87 kg/m2  Constitutional:  Alert and oriented, No acute distress. HEENT: [place] AT, moist mucus membranes.  Trachea midline, no masses. Cardiovascular: No clubbing, cyanosis, or edema. Respiratory: Normal respiratory effort, no increased work of breathing. GI: Abdomen is soft, nontender, nondistended, no abdominal masses GU: No CVA tenderness.  Skin: No rashes, bruises or suspicious lesions. Lymph: No cervical or inguinal adenopathy. Neurologic: Grossly intact, no focal deficits, moving all 4 extremities. Psychiatric: Normal mood and affect.  Laboratory Data: Lab Results  Component Value Date   WBC 8.4 04/21/2015   HGB 15.8 04/21/2015   HCT 48.2 04/21/2015   MCV 91.4 04/21/2015   PLT 179 04/21/2015    Lab Results  Component Value Date   CREATININE 1.53* 08/30/2015    No results found for: PSA  No results found for: TESTOSTERONE  No results found for: HGBA1C  Urinalysis    Component Value Date/Time   COLORURINE YELLOW* 04/21/2015 0048   APPEARANCEUR Clear 09/13/2015 1124   APPEARANCEUR HAZY* 04/21/2015 0048   LABSPEC 1.020 04/21/2015 0048   PHURINE 6.0 04/21/2015 0048   GLUCOSEU Negative 09/13/2015 1124   HGBUR 3+* 04/21/2015 0048    BILIRUBINUR Negative 09/13/2015 1124   BILIRUBINUR NEGATIVE 04/21/2015 0048   KETONESUR NEGATIVE 04/21/2015 0048   PROTEINUR Trace* 09/13/2015 1124   PROTEINUR 100* 04/21/2015 0048   NITRITE Negative 09/13/2015 1124   NITRITE NEGATIVE 04/21/2015 0048   LEUKOCYTESUR Negative 09/13/2015 1124   LEUKOCYTESUR NEGATIVE 04/21/2015 0048    Pertinent Imaging: On my review of this KUB from today, the patient has a slightly decreased size and distal movement of his left ureteral stone. It is still however present. Right renal stones are stable.  Assessment & Plan:    1. Left distal ureteral stone s/p ESWL The patient has residual left ureteral stone status post lithotripsy. Over the last month, it is slightly moved distally and has decreased slightly in size. The patient is currently asymptomatic at this time. We'll continue medical expulsive therapy. He will return in 1 month with a KUB.  2. Recurrent nephrolithiasis We'll consider 24 hour urine collection after stones up in clinic next  3.  Bilateral nephrolithiasis with largest stone burden on the lright We'll address this after he has cleared his left ureteral stone.   Return in about 4 weeks (around 11/29/2015) for with KUB prior.  Hildred Laser, MD  [facility] [address] [place], [place] [address] [phone]

## 2015-11-29 ENCOUNTER — Ambulatory Visit: Payer: 59 | Admitting: Urology

## 2015-12-06 ENCOUNTER — Ambulatory Visit: Payer: 59 | Admitting: Urology

## 2015-12-27 ENCOUNTER — Ambulatory Visit (INDEPENDENT_AMBULATORY_CARE_PROVIDER_SITE_OTHER): Payer: 59 | Admitting: Urology

## 2015-12-27 ENCOUNTER — Encounter: Payer: Self-pay | Admitting: Urology

## 2015-12-27 ENCOUNTER — Ambulatory Visit
Admission: RE | Admit: 2015-12-27 | Discharge: 2015-12-27 | Disposition: A | Discharge status: Home / Self Care | Payer: 59 | Source: Ambulatory Visit | Attending: Urology | Admitting: Urology

## 2015-12-27 VITALS — BP 127/91 | HR 62 | Ht 69.0 in | Wt 212.4 lb

## 2015-12-27 DIAGNOSIS — N201 Calculus of ureter: Secondary | ICD-10-CM | POA: Insufficient documentation

## 2015-12-27 DIAGNOSIS — Z87442 Personal history of urinary calculi: Secondary | ICD-10-CM

## 2015-12-27 DIAGNOSIS — N200 Calculus of kidney: Secondary | ICD-10-CM

## 2015-12-27 DIAGNOSIS — R3129 Other microscopic hematuria: Secondary | ICD-10-CM

## 2015-12-27 NOTE — Progress Notes (Signed)
12/27/2015 10:46 AM   Cameron Freeman [birth year] [id number]  Referring provider: Phineas Real [facility] [address] [place], [place] [address]  Chief Complaint  Patient presents with  . Follow-up    Renal stones, KUB    HPI: The patient is a 35 year old gentleman with a past medical history significant for recurrent nephrolithiasis who presents after undergoing lithotripsy for distal 6 mm left ureteral stone. He follows up for KUB to monitor stone passage. At his last visit, the stone appeared to decrease in size but was relatively in the same position in the distal ureter. Today, the stone has moved distally and now appears to be near the UVJ.  He had an episode of left flank pain one time a few weeks old record him to go to the urgent care. Spontaneous resolved.   PMH: Past Medical History  Diagnosis Date  . Kidney stones   . Microscopic hematuria     Surgical History: Past Surgical History  Procedure Laterality Date  . None    . No past surgeries    . Extracorporeal shock wave lithotripsy Left 09/20/2015    Procedure: EXTRACORPOREAL SHOCK WAVE LITHOTRIPSY (ESWL);  Surgeon: Hildred Laser, MD;  Location: [facility] ORS;  Service: Urology;  Laterality: Left;    Home Medications:    Medication List    Notice  As of 12/27/2015 10:46 AM   You have not been prescribed any medications.      Allergies: No Known Allergies  Family History: Family History  Problem Relation Age of Onset  . Kidney disease Maternal Aunt     dialysis  . Prostate cancer Neg Hx   . Lung cancer Father   . Kidney disease Mother     one kidney  . Kidney Stones      Social History:  reports that he has never smoked. He has never used smokeless tobacco. He reports that he drinks alcohol. He reports that he uses illicit drugs (Marijuana).  ROS:                                        Physical Exam: BP 127/91 mmHg  Pulse 62  Ht  (1.753  m)  Wt 212 lb 6.4 oz (96.344 kg)  BMI 31.35 kg/m2  Constitutional:  Alert and oriented, No acute distress. HEENT: [place] AT, moist mucus membranes.  Trachea midline, no masses. Cardiovascular: No clubbing, cyanosis, or edema. Respiratory: Normal respiratory effort, no increased work of breathing. GI: Abdomen is soft, nontender, nondistended, no abdominal masses GU: No CVA tenderness.  Skin: No rashes, bruises or suspicious lesions. Lymph: No cervical or inguinal adenopathy. Neurologic: Grossly intact, no focal deficits, moving all 4 extremities. Psychiatric: Normal mood and affect.  Laboratory Data: Lab Results  Component Value Date   WBC 8.4 04/21/2015   HGB 15.8 04/21/2015   HCT 48.2 04/21/2015   MCV 91.4 04/21/2015   PLT 179 04/21/2015    Lab Results  Component Value Date   CREATININE 1.53* 08/30/2015    No results found for: PSA  No results found for: TESTOSTERONE  No results found for: HGBA1C  Urinalysis    Component Value Date/Time   COLORURINE YELLOW* 04/21/2015 0048   APPEARANCEUR Clear 11/01/2015 1132   APPEARANCEUR HAZY* 04/21/2015 0048   LABSPEC 1.020 04/21/2015 0048   PHURINE 6.0 04/21/2015 0048   GLUCOSEU Negative 11/01/2015 1132  HGBUR 3+* 04/21/2015 0048   BILIRUBINUR Negative 11/01/2015 1132   BILIRUBINUR NEGATIVE 04/21/2015 0048   KETONESUR NEGATIVE 04/21/2015 0048   PROTEINUR 2+* 11/01/2015 1132   PROTEINUR 100* 04/21/2015 0048   NITRITE Negative 11/01/2015 1132   NITRITE NEGATIVE 04/21/2015 0048   LEUKOCYTESUR Negative 11/01/2015 1132   LEUKOCYTESUR NEGATIVE 04/21/2015 0048    Pertinent Imaging: On my review, the stone has progressed now to the distal ureter near the UVJ on the left.  Assessment & Plan:    1. Left distal ureteral stone s/p ESWL The patient still has not passed his left ureteral stone though it has now progressed to the left UVJ. We discussed surgical options at this point as it is been a number of months since his  lithotripsy. We discussed both ureteroscopy and repeat lithotripsy. He is leaning towards undergoing left ureteroscopy. We will also address his right renal stone burden during this procedure if he elects to undergo it. Due to financial reasons as he is still paying for his first lithotripsy, the patient would like to wait 1 more month before scheduling surgery to see if the stone will spontaneously pass. If it does not pass by his next visit, we will set him up for bilateral ureteroscopy to address both his left distal ureteral stone as well as his right renal stones.   Return in about 4 weeks (around 01/24/2016) for with KUB prior.  Hildred LaserBrian James Espn Zeman, MD  [facility] [address], [address] [place], [place] [address] [phone]

## 2015-12-27 NOTE — Addendum Note (Signed)
Addended by: Lonna CobbUSSELL, KELITA L on: 12/27/2015 04:35 PM   Modules accepted: Orders

## 2016-01-31 ENCOUNTER — Ambulatory Visit: Payer: 59 | Admitting: Urology

## 2016-02-18 ENCOUNTER — Ambulatory Visit: Payer: 59

## 2016-02-21 ENCOUNTER — Ambulatory Visit: Payer: 59 | Admitting: Urology

## 2016-02-21 ENCOUNTER — Telehealth: Payer: Self-pay | Admitting: Urology

## 2016-02-21 NOTE — Telephone Encounter (Signed)
Patient called and said that he has not been able to get off of work to come to his appt. He is not having any problems right now and will call back to reschd if needed at a later date.   Cameron Freeman

## 2016-04-14 ENCOUNTER — Ambulatory Visit
Admission: RE | Admit: 2016-04-14 | Discharge: 2016-04-14 | Disposition: A | Discharge status: Home / Self Care | Payer: 59 | Source: Ambulatory Visit | Attending: Urology | Admitting: Urology

## 2016-04-14 ENCOUNTER — Encounter: Payer: Self-pay | Admitting: Urology

## 2016-04-14 ENCOUNTER — Ambulatory Visit (INDEPENDENT_AMBULATORY_CARE_PROVIDER_SITE_OTHER): Payer: 59 | Admitting: Urology

## 2016-04-14 VITALS — BP 133/89 | HR 56 | Ht 69.0 in | Wt 214.9 lb

## 2016-04-14 DIAGNOSIS — N200 Calculus of kidney: Secondary | ICD-10-CM

## 2016-04-14 DIAGNOSIS — N201 Calculus of ureter: Secondary | ICD-10-CM | POA: Diagnosis not present

## 2016-04-14 DIAGNOSIS — N183 Chronic kidney disease, stage 3 unspecified: Secondary | ICD-10-CM | POA: Insufficient documentation

## 2016-04-14 LAB — URINALYSIS, COMPLETE
BILIRUBIN UA: NEGATIVE
Glucose, UA: NEGATIVE
Ketones, UA: NEGATIVE
Leukocytes, UA: NEGATIVE
Nitrite, UA: NEGATIVE
PH UA: 5.5 (ref 5.0–7.5)
PROTEIN UA: NEGATIVE
RBC UA: NEGATIVE
Specific Gravity, UA: 1.025 (ref 1.005–1.030)
UUROB: 0.2 mg/dL (ref 0.2–1.0)

## 2016-04-14 LAB — MICROSCOPIC EXAMINATION

## 2016-04-14 NOTE — Progress Notes (Signed)
04/14/2016 6:47 AM   Cameron Freeman 23-Oct-1980 [id number]  Referring provider: Phineas Real [facility] [address] [place], [place] [address]  No chief complaint on file.   HPI:  1 - Recurrent Nephrolithiasis  -  Pre 2017 medical passage x many since age 35 08/2015 - Left SWL for 6mm left distal stone, CT at time with Rt > Lt non-obstructing renal stones, Rt sided nephrocalcinosis.   2 - Metabolic Stone Disease -  Eval 2017 - BMP,PTH,Urate - pending; Composition - pending; 24 Hr Urines - pending  3 - Stage 3 Chronic Renal Insufficiency - Cr 1.5's / GFR 55-60 range x several before ate 37.   Today "Cameron Freeman" is seen in f/u above and KUB. He passed numerous small stone fragments in interval and KUB today suggests interval passage of piror distal stone. No colic as well. Unfortnatley he has not been able to collect any fragments as usually happens at work.    PMH: Past Medical History:  Diagnosis Date  . Kidney stones   . Microscopic hematuria     Surgical History: Past Surgical History:  Procedure Laterality Date  . EXTRACORPOREAL SHOCK WAVE LITHOTRIPSY Left 09/20/2015   Procedure: EXTRACORPOREAL SHOCK WAVE LITHOTRIPSY (ESWL);  Surgeon: Hildred Laser, MD;  Location: [facility] ORS;  Service: Urology;  Laterality: Left;  . NO PAST SURGERIES    . none      Home Medications:    Medication List    as of 04/14/2016  6:47 AM   You have not been prescribed any medications.     Allergies: No Known Allergies  Family History: Family History  Problem Relation Age of Onset  . Kidney disease Maternal Aunt     dialysis  . Prostate cancer Neg Hx   . Lung cancer Father   . Kidney disease Mother     one kidney  . Kidney Stones      Social History:  reports that he has never smoked. He has never used smokeless tobacco. He reports that he drinks alcohol. He reports that he uses drugs, including Marijuana.     Review of  Systems  Gastrointestinal (upper)  : Negative for upper GI symptoms  Gastrointestinal (lower) : Negative for lower GI symptoms  Constitutional : Negative for symptoms  Skin: Negative for skin symptoms  Eyes: Negative for eye symptoms  Ear/Nose/Throat : Negative for Ear/Nose/Throat symptoms  Hematologic/Lymphatic: Negative for Hematologic/Lymphatic symptoms  Cardiovascular : Negative for cardiovascular symptoms  Respiratory : Negative for respiratory symptoms  Endocrine: Negative for endocrine symptoms  Musculoskeletal: Negative for musculoskeletal symptoms  Neurological: Negative for neurological symptoms  Psychologic: Negative for psychiatric symptoms      Physical Exam: There were no vitals taken for this visit.  Constitutional:  Alert and oriented, No acute distress. HEENT: [place] AT, moist mucus membranes.  Trachea midline, no masses. Cardiovascular: No clubbing, cyanosis, or edema. Respiratory: Normal respiratory effort, no increased work of breathing. GI: Abdomen is soft, nontender, nondistended, no abdominal masses GU: No CVA tenderness. Circ'd, Testes down w/o masses.  Skin: No rashes, bruises or suspicious lesions. Lymph: No cervical or inguinal adenopathy. Neurologic: Grossly intact, no focal deficits, moving all 4 extremities. Psychiatric: Normal mood and affect.  Laboratory Data: Lab Results  Component Value Date   WBC 8.4 04/21/2015   HGB 15.8 04/21/2015   HCT 48.2 04/21/2015   MCV 91.4 04/21/2015   PLT 179 04/21/2015    Lab Results  Component Value Date   CREATININE  1.53 (H) 08/30/2015    No results found for: PSA  No results found for: TESTOSTERONE  No results found for: HGBA1C  Urinalysis    Component Value Date/Time   COLORURINE YELLOW (A) 04/21/2015 0048   APPEARANCEUR Clear 11/01/2015 1132   LABSPEC 1.020 04/21/2015 0048   PHURINE 6.0 04/21/2015 0048   GLUCOSEU Negative 11/01/2015 1132   HGBUR 3+ (A) 04/21/2015 0048    BILIRUBINUR Negative 11/01/2015 1132   KETONESUR NEGATIVE 04/21/2015 0048   PROTEINUR 2+ (A) 11/01/2015 1132   PROTEINUR 100 (A) 04/21/2015 0048   NITRITE Negative 11/01/2015 1132   NITRITE NEGATIVE 04/21/2015 0048   LEUKOCYTESUR Negative 11/01/2015 1132    Pertinent Imaging: As per above  Assessment & Plan:    1 - Recurrent Nephrolithiasis  - Now with interval passage of prior ureteral stone. At minimun, rec yearly surveillance with KUB / RUS as he is VERY high risk stone former.   2 - Metabolic Stone Disease - Rec complete metabolic eval  With BMP, PTH, Urate today and composition / 24 hr uriens when current ureteral stone out.   3 - Stage 3 Chronic Renal Insufficiency - Frankly discussed implicaiotns of this at his young age with very real risk of progression to eventual ESRD. Rec nephrology eval now. This is likely exacerbated by his recurrent stones.   RTC 6 weeks with 24 hr urines in interval.   Sebastian AcheMANNY, Dermot Gremillion, MD  [facility] [address] [place], [place] [address] [phone]

## 2016-04-15 LAB — BASIC METABOLIC PANEL
BUN / CREAT RATIO: 14 (ref 9–20)
BUN: 16 mg/dL (ref 6–20)
CO2: 22 mmol/L (ref 18–29)
CREATININE: 1.15 mg/dL (ref 0.76–1.27)
Calcium: 9.4 mg/dL (ref 8.7–10.2)
Chloride: 105 mmol/L (ref 96–106)
GFR calc non Af Amer: 83 mL/min/{1.73_m2} (ref >59)
GFR, EST AFRICAN AMERICAN: 96 mL/min/{1.73_m2} (ref >59)
Glucose: 88 mg/dL (ref 65–99)
Potassium: 5.3 mmol/L — ABNORMAL HIGH (ref 3.5–5.2)
SODIUM: 143 mmol/L (ref 134–144)

## 2016-04-15 LAB — PTH, INTACT AND CALCIUM
Calcium: 9.4 mg/dL (ref 8.7–10.2)
PTH: 28 pg/mL (ref 15–65)

## 2016-04-15 LAB — URIC ACID: URIC ACID: 5.6 mg/dL (ref 3.7–8.6)

## 2016-04-15 LAB — PLEASE NOTE

## 2016-05-29 ENCOUNTER — Ambulatory Visit: Payer: 59

## 2016-06-05 ENCOUNTER — Ambulatory Visit: Payer: 59 | Admitting: Urology

## 2016-06-13 ENCOUNTER — Other Ambulatory Visit: Payer: 59

## 2016-06-23 ENCOUNTER — Other Ambulatory Visit: Payer: Self-pay | Admitting: Urology

## 2016-06-24 ENCOUNTER — Other Ambulatory Visit: Payer: Self-pay | Admitting: Urology

## 2016-06-26 ENCOUNTER — Ambulatory Visit: Payer: 59 | Admitting: Urology

## 2016-07-17 ENCOUNTER — Ambulatory Visit: Payer: 59 | Admitting: Urology

## 2016-07-23 ENCOUNTER — Telehealth: Payer: Self-pay

## 2016-07-23 NOTE — Telephone Encounter (Signed)
Cameron Acheheodore Manny, MD  Skeet Latchhelsea C Watkins, LPN        Please notify pt that recent prior blood work for recurrent stones is normal, but 24 hr urines do show some elevated urinary calcium. This is likely contributing to his recurrent stones and in known as "renal leak hypercalciuria". This can be corrected through a daily medication that is safe and cheap.   I suggest f/u visit to discuss.   Thanks,  Cameron Freeman    Spoke with pt in reference to 24hr urine results and f/u appt. Pt voiced understanding.

## 2016-07-24 ENCOUNTER — Other Ambulatory Visit: Payer: Self-pay | Admitting: Urology

## 2016-08-12 ENCOUNTER — Ambulatory Visit: Payer: 59 | Admitting: Urology

## 2016-08-12 ENCOUNTER — Encounter: Payer: Self-pay | Admitting: Urology

## 2016-08-12 VITALS — BP 164/118 | HR 71 | Ht 69.0 in | Wt 213.5 lb

## 2016-08-12 DIAGNOSIS — N183 Chronic kidney disease, stage 3 unspecified: Secondary | ICD-10-CM

## 2016-08-12 DIAGNOSIS — R82994 Hypercalciuria: Secondary | ICD-10-CM

## 2016-08-12 DIAGNOSIS — N201 Calculus of ureter: Secondary | ICD-10-CM

## 2016-08-12 LAB — URINALYSIS, COMPLETE
Bilirubin, UA: NEGATIVE
GLUCOSE, UA: NEGATIVE
KETONES UA: NEGATIVE
Leukocytes, UA: NEGATIVE
Nitrite, UA: NEGATIVE
SPEC GRAV UA: 1.025 (ref 1.005–1.030)
UUROB: 0.2 mg/dL (ref 0.2–1.0)
pH, UA: 5.5 (ref 5.0–7.5)

## 2016-08-12 LAB — MICROSCOPIC EXAMINATION: Bacteria, UA: NONE SEEN

## 2016-08-12 MED ORDER — INDAPAMIDE 2.5 MG PO TABS: 2.5000 mg | ORAL_TABLET | Freq: Every day | ORAL | 3 refills | Status: DC

## 2016-08-12 NOTE — Progress Notes (Signed)
08/12/2016 6:02 AM   Cameron Freeman 06-07-1981 [id number]  Referring provider: Phineas Realharles Freeman [facility] [address] [place], [place] [address]  CC: F/U Nephrolithiasis  HPI:  1 - Recurrent Nephrolithiasis  -  Pre 2017 medical passage x many since age 89 08/2015 - Left SWL for 6mm left distal stone, CT at time with Rt > Lt non-obstructing renal stones, Rt sided nephrocalcinosis.   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   Today "Cameron Freeman" is seen in f/u above to discuss metabolic management to his recurrent stone disease. He also would like UCX / GC testing.    PMH: Past Medical History:  Diagnosis Date  . Kidney stones   . Microscopic hematuria     Surgical History: Past Surgical History:  Procedure Laterality Date  . EXTRACORPOREAL SHOCK WAVE LITHOTRIPSY Left 09/20/2015   Procedure: EXTRACORPOREAL SHOCK WAVE LITHOTRIPSY (ESWL);  Surgeon: Hildred LaserBrian James Budzyn, MD;  Location: [facility] ORS;  Service: Urology;  Laterality: Left;  . NO PAST SURGERIES    . none      Home Medications:  Allergies as of 08/12/2016   No Known Allergies     Medication List    as of 08/12/2016  6:02 AM   You have not been prescribed any medications.     Allergies: No Known Allergies  Family History: Family History  Problem Relation Age of Onset  . Kidney disease Maternal Aunt     dialysis  . Prostate cancer Neg Hx   . Lung cancer Father   . Kidney disease Mother     one kidney  . Kidney Stones      Social History:  reports that he has never smoked. He has never used smokeless tobacco. He reports that he drinks alcohol. He reports that he uses drugs, including Marijuana.     Review of  Systems  Gastrointestinal (upper)  : Negative for upper GI symptoms  Gastrointestinal (lower) : Negative for lower GI symptoms  Constitutional : Negative for symptoms  Skin: Negative for skin symptoms  Eyes: Negative for eye symptoms  Ear/Nose/Throat : Negative for Ear/Nose/Throat symptoms  Hematologic/Lymphatic: Negative for Hematologic/Lymphatic symptoms  Cardiovascular : Negative for cardiovascular symptoms  Respiratory : Negative for respiratory symptoms  Endocrine: Negative for endocrine symptoms  Musculoskeletal: Negative for musculoskeletal symptoms  Neurological: Negative for neurological symptoms  Psychologic: Negative for psychiatric symptoms      Physical Exam: There were no vitals taken for this visit.  Constitutional:  Alert and oriented, No acute distress. HEENT: [place] AT, moist mucus membranes.  Trachea midline, no masses. Cardiovascular: No clubbing, cyanosis, or edema. Respiratory: Normal respiratory effort, no increased work of breathing. GI: Abdomen is soft, nontender, nondistended, no abdominal masses GU: No CVA tenderness. Circ'd, Testes down w/o masses.  Skin: No rashes, bruises or suspicious lesions. Lymph: No cervical or inguinal adenopathy. Neurologic: Grossly intact, no focal deficits, moving all 4 extremities. Psychiatric: Normal mood and affect.  Laboratory Data: Lab Results  Component Value Date   WBC 8.4 04/21/2015   HGB 15.8 04/21/2015   HCT 48.2 04/21/2015   MCV 91.4 04/21/2015   PLT 179 04/21/2015    Lab Results  Component Value Date  CREATININE 1.15 04/14/2016    No results found for: PSA  No results found for: TESTOSTERONE  No results found for: HGBA1C  Urinalysis    Component Value Date/Time   COLORURINE YELLOW (A) 04/21/2015 0048   APPEARANCEUR Clear 04/14/2016 1154   LABSPEC 1.020 04/21/2015 0048   PHURINE 6.0 04/21/2015 0048   GLUCOSEU Negative 04/14/2016 1154   HGBUR 3+ (A) 04/21/2015 0048    BILIRUBINUR Negative 04/14/2016 1154   KETONESUR NEGATIVE 04/21/2015 0048   PROTEINUR Negative 04/14/2016 1154   PROTEINUR 100 (A) 04/21/2015 0048   NITRITE Negative 04/14/2016 1154   NITRITE NEGATIVE 04/21/2015 0048   LEUKOCYTESUR Negative 04/14/2016 1154    Pertinent Imaging: As per above  Assessment & Plan:    1 - Recurrent Nephrolithiasis  - Now with interval passage of prior ureteral stone. At minimun, rec yearly surveillance with KUB / RUS as he is VERY high risk stone former.   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 RX'd today.   3 - Stage 3 Chronic Renal Insufficiency - Frankly discussed implicaiotns of this at his young age with emphasis on BP and glycemic control and stone management.   Also UCX, GC today.   RTC 1 year with KUB, RUS, BMP.  Sebastian Ache, MD  [facility] [address], [address] [place], [place] [address] [phone]

## 2016-08-14 LAB — URINE CULTURE: ORGANISM ID, BACTERIA: NO GROWTH

## 2016-08-14 LAB — GC/CHLAMYDIA PROBE AMP
CHLAMYDIA, DNA PROBE: NEGATIVE
NEISSERIA GONORRHOEAE BY PCR: NEGATIVE

## 2017-07-10 ENCOUNTER — Telehealth: Payer: Self-pay | Admitting: Urology

## 2017-07-10 NOTE — Telephone Encounter (Signed)
This patient saw Berneice HeinrichManny but I am reschd him with Sherryl BartersBudzyn but he needs a RUS prior to this app but there is not an order in for it. Can you out one in please? Thanks,  Marcelino DusterMichelle

## 2017-07-13 ENCOUNTER — Other Ambulatory Visit: Payer: Self-pay | Admitting: Family Medicine

## 2017-07-13 DIAGNOSIS — N183 Chronic kidney disease, stage 3 unspecified: Secondary | ICD-10-CM

## 2017-08-12 ENCOUNTER — Ambulatory Visit: Payer: 59

## 2017-08-14 ENCOUNTER — Ambulatory Visit: Payer: 59

## 2017-08-21 ENCOUNTER — Ambulatory Visit: Payer: 59

## 2018-06-13 ENCOUNTER — Emergency Department: Payer: Self-pay

## 2018-06-13 ENCOUNTER — Encounter: Payer: Self-pay | Admitting: Intensive Care

## 2018-06-13 ENCOUNTER — Other Ambulatory Visit: Payer: Self-pay

## 2018-06-13 ENCOUNTER — Inpatient Hospital Stay
Admission: EM | Admit: 2018-06-13 | Discharge: 2018-06-15 | DRG: 661 | Disposition: A | Discharge status: Home / Self Care | Payer: 59 | Attending: Internal Medicine | Admitting: Internal Medicine

## 2018-06-13 DIAGNOSIS — Z87442 Personal history of urinary calculi: Secondary | ICD-10-CM

## 2018-06-13 DIAGNOSIS — I129 Hypertensive chronic kidney disease with stage 1 through stage 4 chronic kidney disease, or unspecified chronic kidney disease: Secondary | ICD-10-CM | POA: Diagnosis present

## 2018-06-13 DIAGNOSIS — R748 Abnormal levels of other serum enzymes: Secondary | ICD-10-CM | POA: Diagnosis present

## 2018-06-13 DIAGNOSIS — F17290 Nicotine dependence, other tobacco product, uncomplicated: Secondary | ICD-10-CM | POA: Diagnosis present

## 2018-06-13 DIAGNOSIS — Z841 Family history of disorders of kidney and ureter: Secondary | ICD-10-CM

## 2018-06-13 DIAGNOSIS — Z79899 Other long term (current) drug therapy: Secondary | ICD-10-CM

## 2018-06-13 DIAGNOSIS — R109 Unspecified abdominal pain: Secondary | ICD-10-CM

## 2018-06-13 DIAGNOSIS — N200 Calculus of kidney: Secondary | ICD-10-CM

## 2018-06-13 DIAGNOSIS — Z7151 Drug abuse counseling and surveillance of drug abuser: Secondary | ICD-10-CM

## 2018-06-13 DIAGNOSIS — N132 Hydronephrosis with renal and ureteral calculous obstruction: Principal | ICD-10-CM

## 2018-06-13 DIAGNOSIS — N179 Acute kidney failure, unspecified: Secondary | ICD-10-CM

## 2018-06-13 DIAGNOSIS — F1910 Other psychoactive substance abuse, uncomplicated: Secondary | ICD-10-CM | POA: Diagnosis present

## 2018-06-13 DIAGNOSIS — N1330 Unspecified hydronephrosis: Secondary | ICD-10-CM | POA: Diagnosis present

## 2018-06-13 DIAGNOSIS — N183 Chronic kidney disease, stage 3 (moderate): Secondary | ICD-10-CM | POA: Diagnosis present

## 2018-06-13 LAB — BASIC METABOLIC PANEL
ANION GAP: 11 (ref 5–15)
BUN: 22 mg/dL — AB (ref 6–20)
CO2: 25 mmol/L (ref 22–32)
CREATININE: 2.3 mg/dL — AB (ref 0.61–1.24)
Calcium: 9.3 mg/dL (ref 8.9–10.3)
Chloride: 103 mmol/L (ref 98–111)
GFR calc Af Amer: 41 mL/min — ABNORMAL LOW (ref >60)
GFR calc non Af Amer: 35 mL/min — ABNORMAL LOW (ref >60)
GLUCOSE: 115 mg/dL — AB (ref 70–99)
Potassium: 4.5 mmol/L (ref 3.5–5.1)
Sodium: 139 mmol/L (ref 135–145)

## 2018-06-13 LAB — URINALYSIS, COMPLETE (UACMP) WITH MICROSCOPIC
BILIRUBIN URINE: NEGATIVE
GLUCOSE, UA: NEGATIVE mg/dL
KETONES UR: 20 mg/dL — AB
LEUKOCYTES UA: NEGATIVE
Nitrite: NEGATIVE
PH: 5 (ref 5.0–8.0)
PROTEIN: 100 mg/dL — AB
Specific Gravity, Urine: 1.017 (ref 1.005–1.030)

## 2018-06-13 LAB — CBC
HCT: 46.7 % (ref 39.0–52.0)
Hemoglobin: 16 g/dL (ref 13.0–17.0)
MCH: 31.6 pg (ref 26.0–34.0)
MCHC: 34.3 g/dL (ref 30.0–36.0)
MCV: 92.1 fL (ref 80.0–100.0)
NRBC: 0 % (ref 0.0–0.2)
PLATELETS: 184 10*3/uL (ref 150–400)
RBC: 5.07 MIL/uL (ref 4.22–5.81)
RDW: 12 % (ref 11.5–15.5)
WBC: 9.2 10*3/uL (ref 4.0–10.5)

## 2018-06-13 LAB — LIPASE, BLOOD: Lipase: 78 U/L — ABNORMAL HIGH (ref 11–51)

## 2018-06-13 MED ORDER — ACETAMINOPHEN 325 MG PO TABS: 650.0000 mg | ORAL_TABLET | Freq: Four times a day (QID) | ORAL | Status: DC | PRN

## 2018-06-13 MED ORDER — ACETAMINOPHEN 650 MG RE SUPP: 650.0000 mg | Freq: Four times a day (QID) | RECTAL | Status: DC | PRN

## 2018-06-13 MED ORDER — ONDANSETRON HCL 4 MG PO TABS: 4.0000 mg | ORAL_TABLET | Freq: Four times a day (QID) | ORAL | Status: DC | PRN

## 2018-06-13 MED ORDER — FENTANYL CITRATE (PF) 100 MCG/2ML IJ SOLN
50.0000 ug | INTRAMUSCULAR | Status: DC | PRN
  Administered 2018-06-13: 50 ug via INTRAVENOUS
  Filled 2018-06-13: qty 2

## 2018-06-13 MED ORDER — ENOXAPARIN SODIUM 40 MG/0.4ML ~~LOC~~ SOLN
40.0000 mg | SUBCUTANEOUS | Status: DC
  Administered 2018-06-14: 40 mg via SUBCUTANEOUS
  Filled 2018-06-13: qty 0.4

## 2018-06-13 MED ORDER — SODIUM CHLORIDE 0.9 % IV BOLUS
1000.0000 mL | Freq: Once | INTRAVENOUS | Status: AC
  Administered 2018-06-13: 1000 mL via INTRAVENOUS

## 2018-06-13 MED ORDER — ONDANSETRON HCL 4 MG/2ML IJ SOLN: 4.0000 mg | Freq: Four times a day (QID) | INTRAMUSCULAR | Status: DC | PRN

## 2018-06-13 MED ORDER — OXYCODONE HCL 5 MG PO TABS
5.0000 mg | ORAL_TABLET | ORAL | Status: DC | PRN
  Administered 2018-06-15: 5 mg via ORAL
  Filled 2018-06-13: qty 1

## 2018-06-13 MED ORDER — ONDANSETRON HCL 4 MG/2ML IJ SOLN: 4.0000 mg | Freq: Once | INTRAMUSCULAR | Status: DC | PRN

## 2018-06-13 MED ORDER — HYDRALAZINE HCL 20 MG/ML IJ SOLN: 10.0000 mg | Freq: Four times a day (QID) | INTRAMUSCULAR | Status: DC | PRN

## 2018-06-13 MED ORDER — SODIUM CHLORIDE 0.9 % IV SOLN
INTRAVENOUS | Status: DC
  Administered 2018-06-13 – 2018-06-15 (×4): via INTRAVENOUS

## 2018-06-13 MED ORDER — SODIUM CHLORIDE 0.9 % IV SOLN
1.0000 g | INTRAVENOUS | Status: DC
  Administered 2018-06-13 – 2018-06-14 (×2): 1 g via INTRAVENOUS
  Filled 2018-06-13: qty 10
  Filled 2018-06-13 (×2): qty 1

## 2018-06-13 MED ORDER — HYDROMORPHONE HCL 1 MG/ML IJ SOLN
1.0000 mg | INTRAMUSCULAR | Status: AC
  Administered 2018-06-13: 1 mg via INTRAVENOUS
  Filled 2018-06-13: qty 1

## 2018-06-13 MED ORDER — MORPHINE SULFATE (PF) 2 MG/ML IV SOLN
2.0000 mg | INTRAVENOUS | Status: DC | PRN
  Administered 2018-06-13 – 2018-06-14 (×3): 2 mg via INTRAVENOUS
  Filled 2018-06-13 (×3): qty 1

## 2018-06-13 MED ORDER — ONDANSETRON HCL 4 MG/2ML IJ SOLN
4.0000 mg | Freq: Once | INTRAMUSCULAR | Status: AC
  Administered 2018-06-13: 4 mg via INTRAVENOUS
  Filled 2018-06-13: qty 2

## 2018-06-13 MED ORDER — MORPHINE SULFATE (PF) 4 MG/ML IV SOLN: 4.0000 mg | Freq: Once | INTRAVENOUS | Status: DC

## 2018-06-13 NOTE — ED Notes (Signed)
Pt c/o inability to void despite drinking water - he reports that he has not voided since early this am

## 2018-06-13 NOTE — ED Provider Notes (Signed)
[facility] Emergency Department Provider Note  ____________________________________________  Time seen: Approximately 7:59 PM  I have reviewed the triage vital signs and the nursing notes.   HISTORY  Chief Complaint Flank Pain (right and left)    HPI Charleston Ropesyrone Giambra is a 37 y.o. male with a history of kidney stone disease requiring lithotripsy in the past who  complains of severe right flank pain radiating to right lower quadrant, feels like kidney stones, occurring for the past 3 days.  Constant, waxing and waning, worse today.  Associated with chills but no fever.  He does have nausea and vomiting.  No constipation.  Pain is severe and sharp, no aggravating or alleviating factors.     Past Medical History:  Diagnosis Date  . Kidney stones   . Microscopic hematuria      Patient Active Problem List   Diagnosis Date Noted  . Chronic renal impairment, stage 3 (moderate) (HCC) 04/14/2016  . Left ureteral stone 09/13/2015  . Hydronephrosis with urinary obstruction due to ureteral calculus 09/13/2015  . History of renal stone 09/01/2015  . Microscopic hematuria 09/01/2015     Past Surgical History:  Procedure Laterality Date  . EXTRACORPOREAL SHOCK WAVE LITHOTRIPSY Left 09/20/2015   Procedure: EXTRACORPOREAL SHOCK WAVE LITHOTRIPSY (ESWL);  Surgeon: Hildred LaserBrian James Budzyn, MD;  Location: [facility] ORS;  Service: Urology;  Laterality: Left;  . NO PAST SURGERIES    . none       Prior to Admission medications   Medication Sig Start Date End Date Taking? Authorizing Provider  indapamide (LOZOL) 2.5 MG tablet Take 1 tablet (2.5 mg total) by mouth daily. 08/12/16   Sebastian AcheManny, Theodore, MD     Allergies Patient has no known allergies.   Family History  Problem Relation Age of Onset  . Kidney disease Maternal Aunt        dialysis  . Lung cancer Father   . Kidney disease Mother        one kidney  . Kidney Stones Unknown   . Prostate cancer Neg Hx     Social  History Social History   Tobacco Use  . Smoking status: Never Smoker  . Smokeless tobacco: Never Used  Substance Use Topics  . Alcohol use: Yes    Alcohol/week: 0.0 standard drinks    Comment: occ.   . Drug use: Yes    Types: Marijuana    Review of Systems  Constitutional:   No fever positive chills.  ENT:   No sore throat. No rhinorrhea. Cardiovascular:   No chest pain or syncope. Respiratory:   No dyspnea or cough. Gastrointestinal:   Negative for abdominal pain, positive vomiting.  Musculoskeletal:   Positive right flank pain as above. All other systems reviewed and are negative except as documented above in ROS and HPI.  ____________________________________________   PHYSICAL EXAM:  VITAL SIGNS: ED Triage Vitals  Enc Vitals Group     BP 06/13/18 1341 (!) 180/120     Pulse Rate 06/13/18 1337 65     Resp 06/13/18 1337 18     Temp 06/13/18 1337 98.2 F (36.8 C)     Temp Source 06/13/18 1337 Oral     SpO2 06/13/18 1337 100 %     Weight 06/13/18 1338 205 lb (93 kg)     Height 06/13/18 1338 5\' 9"  (1.753 m)     Head Circumference --      Peak Flow --      Pain Score 06/13/18 1337 10  Pain Loc --      Pain Edu? --      Excl. in GC? --     Vital signs reviewed, nursing assessments reviewed.   Constitutional:   Alert and oriented. Non-toxic appearance. Eyes:   Conjunctivae are normal. EOMI. PERRL. ENT      Head:   Normocephalic and atraumatic.      Nose:   No congestion/rhinnorhea.       Mouth/Throat:   MMM, no pharyngeal erythema. No peritonsillar mass.       Neck:   No meningismus. Full ROM. Hematological/Lymphatic/Immunilogical:   No cervical lymphadenopathy. Cardiovascular:   RRR. Symmetric bilateral radial and DP pulses.  No murmurs. Cap refill less than 2 seconds. Respiratory:   Normal respiratory effort without tachypnea/retractions. Breath sounds are clear and equal bilaterally. No wheezes/rales/rhonchi. Gastrointestinal:   Soft with mild diffuse  right-sided tenderness. Non distended. There is no CVA tenderness.  No rebound, rigidity, or guarding. Musculoskeletal:   Normal range of motion in all extremities. No joint effusions.  No lower extremity tenderness.  No edema. Neurologic:   Normal speech and language.  Motor grossly intact. No acute focal neurologic deficits are appreciated.  Skin:    Skin is warm, dry and intact. No rash noted.  No petechiae, purpura, or bullae.  ____________________________________________    LABS (pertinent positives/negatives) (all labs ordered are listed, but only abnormal results are displayed) Labs Reviewed  URINALYSIS, COMPLETE (UACMP) WITH MICROSCOPIC - Abnormal; Notable for the following components:      Result Value   Color, Urine YELLOW (*)    APPearance CLEAR (*)    Hgb urine dipstick LARGE (*)    Ketones, ur 20 (*)    Protein, ur 100 (*)    RBC / HPF >50 (*)    Bacteria, UA RARE (*)    All other components within normal limits  BASIC METABOLIC PANEL - Abnormal; Notable for the following components:   Glucose, Bld 115 (*)    BUN 22 (*)    Creatinine, Ser 2.30 (*)    GFR calc non Af Amer 35 (*)    GFR calc Af Amer 41 (*)    All other components within normal limits  LIPASE, BLOOD - Abnormal; Notable for the following components:   Lipase 78 (*)    All other components within normal limits  CBC   ____________________________________________   EKG    ____________________________________________    RADIOLOGY  Ct Renal Stone Study  Result Date: 06/13/2018 CLINICAL DATA:  Bilateral flank pain for 3 days history of chronic kidney stone EXAM: CT ABDOMEN AND PELVIS WITHOUT CONTRAST TECHNIQUE: Multidetector CT imaging of the abdomen and pelvis was performed following the standard protocol without IV contrast. COMPARISON:  CT 09/06/2015, radiograph 04/14/2016 FINDINGS: Lower chest: No acute abnormality. Hepatobiliary: No focal liver abnormality is seen. No gallstones, gallbladder  wall thickening, or biliary dilatation. Pancreas: Unremarkable. No pancreatic ductal dilatation or surrounding inflammatory changes. Spleen: Normal in size without focal abnormality. Adrenals/Urinary Tract: Adrenal glands are normal. Multiple bilateral intrarenal calculi, right greater than left. Stones on the right measure up to 6 mm in size. Stones on the left measure up to 3 mm in size. Mild to moderate right hydronephrosis and hydroureter, secondary to 2 stones within the right ureter. More cephalad stone measures 6 mm and is in the proximal right ureter about 1.5 cm distal to the right UPJ. About 1.5 cm caudal to this is a second larger 8 mm stone within the  proximal third of the ureter. Mild left renal pelvis dilatation without ureteral stone. Probable cyst midpole left kidney. Bladder unremarkable Stomach/Bowel: Stomach is within normal limits. Appendix appears normal. No evidence of bowel wall thickening, distention, or inflammatory changes. Vascular/Lymphatic: Mild aortic atherosclerosis without aneurysm. No enlarged abdominal or pelvic lymph nodes. Reproductive: Prostate is unremarkable. Other: Negative for free air or free fluid. Small fat in the umbilical region. Musculoskeletal: No acute or significant osseous findings. IMPRESSION: 1. Moderate right hydronephrosis and hydroureter, secondary to 2 stones in the proximal right ureter as described above. 2. There are numerous bilateral intrarenal calculi. Electronically Signed   By: Jasmine Pang M.D.   On: 06/13/2018 17:38    ____________________________________________   PROCEDURES Procedures  ____________________________________________  DIFFERENTIAL DIAGNOSIS   Appendicitis, colitis, ureteral obstruction, urinary tract infection.  Doubt torsion bowel perforation AAA dissection or biliary disease.  CLINICAL IMPRESSION / ASSESSMENT AND PLAN / ED COURSE  Pertinent labs & imaging results that were available during my care of the patient  were reviewed by me and considered in my medical decision making (see chart for details).    Patient presents with right flank pain radiating to right lower quadrant, consistent with his prior kidney stone syndrome.  Most likely a recurrent kidney stone, but with abdominal tenderness and history of complicated stones in the past, I will obtain CT scan to further evaluate.  Minimal pain relief with fentanyl, I will give Dilaudid 1 mg IV.  Nausea is controlled by Zofran that he received.  IV fluids for hydration.  Clinical Course as of Jun 13 1958  Wynelle Link Jun 13, 2018  1752 CT shows both an 8 mm stone and a 6 mm stone in the right ureter causing moderate hydronephrosis.  With the associated renal insufficiency which I presumed to be acute, will consult urology to evaluate for stent.   [PS]  [id number] D/w urology, Dr. Apolinar Junes. Will plan to admit to medicine for stent placement tomorrow. If UA shows infection, will call back for more urgent intervention. I informed patient of urgency of obtaining urine sample.    [PS]  1858 UA shows no signs of infection.  Plan to admit for stent tomorrow.  Urinalysis, Complete w Microscopic(!) [PS]    Clinical Course User Index [PS] Sharman Cheek, MD     ____________________________________________   FINAL CLINICAL IMPRESSION(S) / ED DIAGNOSES    Final diagnoses:  Right flank pain  Kidney stone  Hydronephrosis with urinary obstruction due to ureteral calculus  AKI (acute kidney injury) [facility])     ED Discharge Orders    None      Portions of this note were generated with dragon dictation software. Dictation errors may occur despite best attempts at proofreading.    Sharman Cheek, MD 06/13/18 2003

## 2018-06-13 NOTE — ED Notes (Signed)
Patient transported to CT 

## 2018-06-13 NOTE — ED Notes (Signed)
Pt bladder scan results were 119 ml

## 2018-06-13 NOTE — H&P (Signed)
[employer] [facility] [facility] [place] [facility]   PATIENT NAME: Race Latour    MR#:  [id number]  DATE OF BIRTH:  Feb 09, 1981  DATE OF ADMISSION:  06/13/2018  PRIMARY CARE PHYSICIAN: [facility]   REQUESTING/REFERRING PHYSICIAN: Dr. Sharman Cheek  CHIEF COMPLAINT:   Chief Complaint  Patient presents with  . Flank Pain    right and left    HISTORY OF PRESENT ILLNESS:  Harshal Sirmon  is a 37 y.o. male with a known history of kidney stones requiring lithotripsy presents to hospital secondary to abdominal pain, nausea and right flank pain for 3 days now. Patient denies any fevers or chills.  Denies any vomiting but has been nauseous.  CT of the abdomen here showing moderate right-sided hydronephrosis and hydroureters with 2 stones in the proximal right ureter. Urology has been consulted, does have mild renal insufficiency on the labs.  No infection noted.  He is being admitted for possible stent placement in a.m. Pressure was elevated on initial presentation, likely secondary to pain.  No known history of hypertension.  PAST MEDICAL HISTORY:   Past Medical History:  Diagnosis Date  . Kidney stones   . Microscopic hematuria     PAST SURGICAL HISTORY:   Past Surgical History:  Procedure Laterality Date  . EXTRACORPOREAL SHOCK WAVE LITHOTRIPSY Left 09/20/2015   Procedure: EXTRACORPOREAL SHOCK WAVE LITHOTRIPSY (ESWL);  Surgeon: Hildred Laser, MD;  Location: [facility] ORS;  Service: Urology;  Laterality: Left;  . NO PAST SURGERIES    . none      SOCIAL HISTORY:   Social History   Tobacco Use  . Smoking status: Current Every Day Smoker    Types: Cigars  . Smokeless tobacco: Never Used  Substance Use Topics  . Alcohol use: Yes    Alcohol/week: 0.0 standard drinks    Comment: liquor and beer    FAMILY HISTORY:   Family History  Problem Relation Age of Onset  . Kidney disease Maternal Aunt        dialysis  . Lung cancer Father     . Kidney disease Mother        one kidney  . Kidney Stones Unknown   . Prostate cancer Neg Hx     DRUG ALLERGIES:  No Known Allergies  REVIEW OF SYSTEMS:   Review of Systems  Constitutional: Negative for chills, fever, malaise/fatigue and weight loss.  HENT: Negative for ear discharge, ear pain, hearing loss and nosebleeds.   Eyes: Negative for blurred vision, double vision and photophobia.  Respiratory: Negative for cough, hemoptysis, shortness of breath and wheezing.   Cardiovascular: Negative for chest pain, palpitations, orthopnea and leg swelling.  Gastrointestinal: Positive for abdominal pain and nausea. Negative for constipation, diarrhea, heartburn, melena and vomiting.  Genitourinary: Positive for flank pain and hematuria. Negative for dysuria, frequency and urgency.  Musculoskeletal: Positive for back pain. Negative for myalgias and neck pain.  Skin: Negative for rash.  Neurological: Negative for dizziness, tingling, tremors, sensory change, speech change, focal weakness and headaches.  Endo/Heme/Allergies: Does not bruise/bleed easily.  Psychiatric/Behavioral: Negative for depression.    MEDICATIONS AT HOME:   Prior to Admission medications   Medication Sig Start Date End Date Taking? Authorizing Provider  indapamide (LOZOL) 2.5 MG tablet Take 1 tablet (2.5 mg total) by mouth daily. 08/12/16   Sebastian Ache, MD      VITAL SIGNS:  Blood pressure (!) 161/113, pulse 63, temperature 98.2 F (36.8 C), temperature source  Oral, resp. rate 18, height 5\' 9"  (1.753 m), weight 93 kg, SpO2 100 %.  PHYSICAL EXAMINATION:   Physical Exam  GENERAL:  37 y.o.-year-old patient lying in the bed with no acute distress.  EYES: Pupils equal, round, reactive to light and accommodation. No scleral icterus. Extraocular muscles intact.  HEENT: Head atraumatic, normocephalic. Oropharynx and nasopharynx clear.  NECK:  Supple, no jugular venous distention. No thyroid enlargement, no  tenderness.  LUNGS: Normal breath sounds bilaterally, no wheezing, rales,rhonchi or crepitation. No use of accessory muscles of respiration.  CARDIOVASCULAR: S1, S2 normal. No murmurs, rubs, or gallops.  ABDOMEN: Soft, diffuse tenderness especially in the epigastric, periumbilical and right flank region without voluntary guarding or rigidity,, nondistended. Bowel sounds present. No organomegaly or mass.  EXTREMITIES: No pedal edema, cyanosis, or clubbing.  NEUROLOGIC: Cranial nerves II through XII are intact. Muscle strength 5/5 in all extremities. Sensation intact. Gait not checked.  PSYCHIATRIC: The patient is alert and oriented x 3.  SKIN: No obvious rash, lesion, or ulcer.   LABORATORY PANEL:   CBC Recent Labs  Lab 06/13/18 1345  WBC 9.2  HGB 16.0  HCT 46.7  PLT 184   ------------------------------------------------------------------------------------------------------------------  Chemistries  Recent Labs  Lab 06/13/18 1345  NA 139  K 4.5  CL 103  CO2 25  GLUCOSE 115*  BUN 22*  CREATININE 2.30*  CALCIUM 9.3   ------------------------------------------------------------------------------------------------------------------  Cardiac Enzymes No results for input(s): TROPONINI in the last 168 hours. ------------------------------------------------------------------------------------------------------------------  RADIOLOGY:  Ct Renal Stone Study  Result Date: 06/13/2018 CLINICAL DATA:  Bilateral flank pain for 3 days history of chronic kidney stone EXAM: CT ABDOMEN AND PELVIS WITHOUT CONTRAST TECHNIQUE: Multidetector CT imaging of the abdomen and pelvis was performed following the standard protocol without IV contrast. COMPARISON:  CT 09/06/2015, radiograph 04/14/2016 FINDINGS: Lower chest: No acute abnormality. Hepatobiliary: No focal liver abnormality is seen. No gallstones, gallbladder wall thickening, or biliary dilatation. Pancreas: Unremarkable. No pancreatic  ductal dilatation or surrounding inflammatory changes. Spleen: Normal in size without focal abnormality. Adrenals/Urinary Tract: Adrenal glands are normal. Multiple bilateral intrarenal calculi, right greater than left. Stones on the right measure up to 6 mm in size. Stones on the left measure up to 3 mm in size. Mild to moderate right hydronephrosis and hydroureter, secondary to 2 stones within the right ureter. More cephalad stone measures 6 mm and is in the proximal right ureter about 1.5 cm distal to the right UPJ. About 1.5 cm caudal to this is a second larger 8 mm stone within the proximal third of the ureter. Mild left renal pelvis dilatation without ureteral stone. Probable cyst midpole left kidney. Bladder unremarkable Stomach/Bowel: Stomach is within normal limits. Appendix appears normal. No evidence of bowel wall thickening, distention, or inflammatory changes. Vascular/Lymphatic: Mild aortic atherosclerosis without aneurysm. No enlarged abdominal or pelvic lymph nodes. Reproductive: Prostate is unremarkable. Other: Negative for free air or free fluid. Small fat in the umbilical region. Musculoskeletal: No acute or significant osseous findings. IMPRESSION: 1. Moderate right hydronephrosis and hydroureter, secondary to 2 stones in the proximal right ureter as described above. 2. There are numerous bilateral intrarenal calculi. Electronically Signed   By: Jasmine PangKim  Fujinaga M.D.   On: 06/13/2018 17:38    EKG:  No orders found for this or any previous visit.  IMPRESSION AND PLAN:   Charleston Ropesyrone Parzych  is a 37 y.o. male with a known history of kidney stones requiring lithotripsy presents to hospital secondary to abdominal pain, nausea and right  flank pain for 3 days now.  1.  Nephrolithiasis-with proximal ureteral stones on right side and right sided hydronephrosis -Admit, pain control -IV fluids -Neurology consulted.  N.p.o. after midnight for possible stent placement in a.m. -IV Rocephin for now and  urine cultures  2.  Acute renal failure-likely secondary to obstruction and hydronephrosis. -IV fluids and continue to monitor  3.  Elevated lipase-patient does have abdominal pain, I will could be reactive elevation.  He does drink alcohol.  Continue to monitor for now.  He will be n.p.o. after midnight and will get IV fluids  4.  Hypertension-could be secondary to pain and anxiety.  IV hydralazine PRN for now.  5.  Polysubstance abuse-counseled for now  6.  DVT Prophylaxis- Lovenox after his procedure    All the records are reviewed and case discussed with ED provider. Management plans discussed with the patient, family and they are in agreement.  CODE STATUS: Full code  TOTAL TIME TAKING CARE OF THIS PATIENT: 50 minutes.    Enid Baas M.D on 06/13/2018 at 8:41 PM  Between 7am to 6pm - Pager - [phone]  After 6pm go to [email or web address] - Social research officer, government  [facility]  Office  [phone]  CC: Primary care physician; [facility]

## 2018-06-13 NOTE — ED Triage Notes (Signed)
Patient c/o right and left sided flank pain X3 days. The pain worsened today. Reports HX of chronic kidney stones

## 2018-06-13 NOTE — ED Notes (Signed)
Pt c/o pain with urination and lower back pain that radiates into right side x3 days - pt reports hx of kidney stones - c/o N/V x3 days - pt reports decreased appetite

## 2018-06-14 ENCOUNTER — Inpatient Hospital Stay: Payer: Self-pay | Admitting: Anesthesiology

## 2018-06-14 ENCOUNTER — Encounter
Admission: EM | Disposition: A | Discharge status: Home / Self Care | Payer: Self-pay | Source: Home / Self Care | Attending: Internal Medicine

## 2018-06-14 ENCOUNTER — Encounter: Payer: Self-pay | Admitting: Anesthesiology

## 2018-06-14 DIAGNOSIS — R109 Unspecified abdominal pain: Secondary | ICD-10-CM

## 2018-06-14 DIAGNOSIS — N1330 Unspecified hydronephrosis: Secondary | ICD-10-CM

## 2018-06-14 DIAGNOSIS — N179 Acute kidney failure, unspecified: Secondary | ICD-10-CM

## 2018-06-14 DIAGNOSIS — N200 Calculus of kidney: Secondary | ICD-10-CM | POA: Diagnosis not present

## 2018-06-14 DIAGNOSIS — N132 Hydronephrosis with renal and ureteral calculous obstruction: Secondary | ICD-10-CM | POA: Diagnosis not present

## 2018-06-14 DIAGNOSIS — N201 Calculus of ureter: Secondary | ICD-10-CM

## 2018-06-14 HISTORY — PX: URETEROSCOPY WITH HOLMIUM LASER LITHOTRIPSY: SHX6645

## 2018-06-14 HISTORY — PX: CYSTOSCOPY WITH STENT PLACEMENT: SHX5790

## 2018-06-14 LAB — CBC
HEMATOCRIT: 43.2 % (ref 39.0–52.0)
Hemoglobin: 14.6 g/dL (ref 13.0–17.0)
MCH: 31.4 pg (ref 26.0–34.0)
MCHC: 33.8 g/dL (ref 30.0–36.0)
MCV: 92.9 fL (ref 80.0–100.0)
NRBC: 0 % (ref 0.0–0.2)
Platelets: 179 10*3/uL (ref 150–400)
RBC: 4.65 MIL/uL (ref 4.22–5.81)
RDW: 11.9 % (ref 11.5–15.5)
WBC: 8.8 10*3/uL (ref 4.0–10.5)

## 2018-06-14 LAB — URINE DRUG SCREEN, QUALITATIVE (ARMC ONLY)
AMPHETAMINES, UR SCREEN: NOT DETECTED
Barbiturates, Ur Screen: NOT DETECTED
Benzodiazepine, Ur Scrn: NOT DETECTED
CANNABINOID 50 NG, UR ~~LOC~~: POSITIVE — AB
Cocaine Metabolite,Ur ~~LOC~~: NOT DETECTED
MDMA (ECSTASY) UR SCREEN: NOT DETECTED
Methadone Scn, Ur: NOT DETECTED
OPIATE, UR SCREEN: POSITIVE — AB
PHENCYCLIDINE (PCP) UR S: NOT DETECTED
Tricyclic, Ur Screen: NOT DETECTED

## 2018-06-14 LAB — BASIC METABOLIC PANEL
Anion gap: 6 (ref 5–15)
BUN: 22 mg/dL — AB (ref 6–20)
CHLORIDE: 107 mmol/L (ref 98–111)
CO2: 24 mmol/L (ref 22–32)
Calcium: 8.3 mg/dL — ABNORMAL LOW (ref 8.9–10.3)
Creatinine, Ser: 2.45 mg/dL — ABNORMAL HIGH (ref 0.61–1.24)
GFR calc non Af Amer: 32 mL/min — ABNORMAL LOW (ref >60)
GFR, EST AFRICAN AMERICAN: 38 mL/min — AB (ref >60)
GLUCOSE: 91 mg/dL (ref 70–99)
Potassium: 4 mmol/L (ref 3.5–5.1)
Sodium: 137 mmol/L (ref 135–145)

## 2018-06-14 SURGERY — CYSTOSCOPY, WITH STENT INSERTION
Anesthesia: General | Laterality: Right

## 2018-06-14 MED ORDER — HYDROMORPHONE HCL 1 MG/ML IJ SOLN
1.0000 mg | INTRAMUSCULAR | Status: DC | PRN
  Administered 2018-06-14 – 2018-06-15 (×4): 1 mg via INTRAVENOUS
  Filled 2018-06-14 (×4): qty 1

## 2018-06-14 MED ORDER — METOPROLOL TARTRATE 25 MG PO TABS
12.5000 mg | ORAL_TABLET | Freq: Two times a day (BID) | ORAL | Status: DC
  Administered 2018-06-14 – 2018-06-15 (×2): 12.5 mg via ORAL
  Filled 2018-06-14 (×2): qty 1

## 2018-06-14 MED ORDER — FENTANYL CITRATE (PF) 100 MCG/2ML IJ SOLN
INTRAMUSCULAR | Status: AC
  Administered 2018-06-14: 25 ug via INTRAVENOUS
  Filled 2018-06-14: qty 2

## 2018-06-14 MED ORDER — PROPOFOL 10 MG/ML IV BOLUS
INTRAVENOUS | Status: DC | PRN
  Administered 2018-06-14: 200 mg via INTRAVENOUS

## 2018-06-14 MED ORDER — LABETALOL HCL 5 MG/ML IV SOLN
INTRAVENOUS | Status: AC
  Administered 2018-06-14: 5 mg via INTRAVENOUS
  Filled 2018-06-14: qty 4

## 2018-06-14 MED ORDER — HYDROMORPHONE HCL 1 MG/ML IJ SOLN
INTRAMUSCULAR | Status: AC
  Administered 2018-06-14: 0.5 mg via INTRAVENOUS
  Filled 2018-06-14: qty 1

## 2018-06-14 MED ORDER — AMLODIPINE BESYLATE 5 MG PO TABS
5.0000 mg | ORAL_TABLET | Freq: Every day | ORAL | Status: DC
  Administered 2018-06-14 – 2018-06-15 (×2): 5 mg via ORAL
  Filled 2018-06-14 (×2): qty 1

## 2018-06-14 MED ORDER — LACTATED RINGERS IV SOLN
INTRAVENOUS | Status: DC | PRN
  Administered 2018-06-14: 16:00:00 via INTRAVENOUS

## 2018-06-14 MED ORDER — FENTANYL CITRATE (PF) 100 MCG/2ML IJ SOLN
INTRAMUSCULAR | Status: AC
  Filled 2018-06-14: qty 2

## 2018-06-14 MED ORDER — FENTANYL CITRATE (PF) 100 MCG/2ML IJ SOLN
25.0000 ug | INTRAMUSCULAR | Status: AC | PRN
  Administered 2018-06-14 (×6): 25 ug via INTRAVENOUS

## 2018-06-14 MED ORDER — FENTANYL CITRATE (PF) 100 MCG/2ML IJ SOLN
INTRAMUSCULAR | Status: DC | PRN
  Administered 2018-06-14: 100 ug via INTRAVENOUS
  Administered 2018-06-14 (×2): 50 ug via INTRAVENOUS

## 2018-06-14 MED ORDER — MIDAZOLAM HCL 2 MG/2ML IJ SOLN
INTRAMUSCULAR | Status: DC | PRN
  Administered 2018-06-14: 2 mg via INTRAVENOUS

## 2018-06-14 MED ORDER — SUCCINYLCHOLINE CHLORIDE 20 MG/ML IJ SOLN
INTRAMUSCULAR | Status: DC | PRN
  Administered 2018-06-14: 100 mg via INTRAVENOUS

## 2018-06-14 MED ORDER — ONDANSETRON HCL 4 MG/2ML IJ SOLN: 4.0000 mg | Freq: Once | INTRAMUSCULAR | Status: DC | PRN

## 2018-06-14 MED ORDER — ROCURONIUM BROMIDE 100 MG/10ML IV SOLN
INTRAVENOUS | Status: DC | PRN
  Administered 2018-06-14: 5 mg via INTRAVENOUS

## 2018-06-14 MED ORDER — DEXAMETHASONE SODIUM PHOSPHATE 10 MG/ML IJ SOLN
INTRAMUSCULAR | Status: DC | PRN
  Administered 2018-06-14: 10 mg via INTRAVENOUS

## 2018-06-14 MED ORDER — GLYCOPYRROLATE 0.2 MG/ML IJ SOLN
INTRAMUSCULAR | Status: AC
  Filled 2018-06-14: qty 1

## 2018-06-14 MED ORDER — LIDOCAINE HCL (PF) 2 % IJ SOLN
INTRAMUSCULAR | Status: AC
  Filled 2018-06-14: qty 10

## 2018-06-14 MED ORDER — LABETALOL HCL 5 MG/ML IV SOLN
5.0000 mg | INTRAVENOUS | Status: AC | PRN
  Administered 2018-06-14 (×2): 5 mg via INTRAVENOUS

## 2018-06-14 MED ORDER — DEXAMETHASONE SODIUM PHOSPHATE 10 MG/ML IJ SOLN
INTRAMUSCULAR | Status: AC
  Filled 2018-06-14: qty 1

## 2018-06-14 MED ORDER — ROCURONIUM BROMIDE 50 MG/5ML IV SOLN
INTRAVENOUS | Status: AC
  Filled 2018-06-14: qty 1

## 2018-06-14 MED ORDER — MIDAZOLAM HCL 2 MG/2ML IJ SOLN
INTRAMUSCULAR | Status: AC
  Filled 2018-06-14: qty 2

## 2018-06-14 MED ORDER — ONDANSETRON HCL 4 MG/2ML IJ SOLN
INTRAMUSCULAR | Status: DC | PRN
  Administered 2018-06-14: 4 mg via INTRAVENOUS

## 2018-06-14 MED ORDER — ONDANSETRON HCL 4 MG/2ML IJ SOLN
INTRAMUSCULAR | Status: AC
  Filled 2018-06-14: qty 2

## 2018-06-14 MED ORDER — LIDOCAINE HCL (CARDIAC) PF 100 MG/5ML IV SOSY
PREFILLED_SYRINGE | INTRAVENOUS | Status: DC | PRN
  Administered 2018-06-14: 50 mg via INTRAVENOUS

## 2018-06-14 MED ORDER — IOPAMIDOL (ISOVUE-200) INJECTION 41%
INTRAVENOUS | Status: DC | PRN
  Administered 2018-06-14: 15 mL

## 2018-06-14 MED ORDER — HYDROMORPHONE HCL 1 MG/ML IJ SOLN
0.5000 mg | INTRAMUSCULAR | Status: DC | PRN
  Administered 2018-06-14 (×2): 0.5 mg via INTRAVENOUS

## 2018-06-14 MED ORDER — GLYCOPYRROLATE 0.2 MG/ML IJ SOLN
INTRAMUSCULAR | Status: DC | PRN
  Administered 2018-06-14: 0.2 mg via INTRAVENOUS

## 2018-06-14 SURGICAL SUPPLY — 32 items
BAG DRAIN CYSTO-URO LG1000N (MISCELLANEOUS) ×2 IMPLANT
BASKET ZERO TIP 1.9FR (BASKET) ×2 IMPLANT
BRUSH SCRUB EZ  4% CHG (MISCELLANEOUS) ×1
BRUSH SCRUB EZ 4% CHG (MISCELLANEOUS) ×1 IMPLANT
CATH URETL 5X70 OPEN END (CATHETERS) ×2 IMPLANT
CNTNR SPEC 2.5X3XGRAD LEK (MISCELLANEOUS)
CONRAY 43 FOR UROLOGY 50M (MISCELLANEOUS) ×2 IMPLANT
CONT SPEC 4OZ STER OR WHT (MISCELLANEOUS)
CONTAINER SPEC 2.5X3XGRAD LEK (MISCELLANEOUS) IMPLANT
COVER WAND RF STERILE (DRAPES) ×2 IMPLANT
DRAPE UTILITY 15X26 TOWEL STRL (DRAPES) ×2 IMPLANT
FIBER LASER LITHO 273 (Laser) ×2 IMPLANT
GLOVE BIO SURGEON STRL SZ 6.5 (GLOVE) ×2 IMPLANT
GOWN STRL REUS W/ TWL LRG LVL3 (GOWN DISPOSABLE) ×2 IMPLANT
GOWN STRL REUS W/TWL LRG LVL3 (GOWN DISPOSABLE) ×2
GUIDEWIRE GREEN .038 145CM (MISCELLANEOUS) ×2 IMPLANT
INFUSOR MANOMETER BAG 3000ML (MISCELLANEOUS) ×2 IMPLANT
INTRODUCER DILATOR DOUBLE (INTRODUCER) IMPLANT
KIT TURNOVER CYSTO (KITS) ×2 IMPLANT
PACK CYSTO AR (MISCELLANEOUS) ×2 IMPLANT
SENSORWIRE 0.038 NOT ANGLED (WIRE) ×2
SET CYSTO W/LG BORE CLAMP LF (SET/KITS/TRAYS/PACK) ×2 IMPLANT
SHEATH URETERAL 12FR 45CM (SHEATH) ×2 IMPLANT
SHEATH URETERAL 12FRX35CM (MISCELLANEOUS) IMPLANT
SOL .9 NS 3000ML IRR  AL (IV SOLUTION) ×1
SOL .9 NS 3000ML IRR UROMATIC (IV SOLUTION) ×1 IMPLANT
STENT URET 6FRX24 CONTOUR (STENTS) IMPLANT
STENT URET 6FRX26 CONTOUR (STENTS) ×2 IMPLANT
SURGILUBE 2OZ TUBE FLIPTOP (MISCELLANEOUS) ×2 IMPLANT
SYRINGE IRR TOOMEY STRL 70CC (SYRINGE) ×2 IMPLANT
WATER STERILE IRR 1000ML POUR (IV SOLUTION) ×2 IMPLANT
WIRE SENSOR 0.038 NOT ANGLED (WIRE) ×1 IMPLANT

## 2018-06-14 NOTE — Progress Notes (Signed)
   06/14/18 1300  Clinical Encounter Type  Visited With Patient  Visit Type Initial;Spiritual support  Recommendations Follow-up, as requested.Marland Kitchen.  Spiritual Encounters  Spiritual Needs Emotional  Stress Factors  Patient Stress Factors Health changes   Patient is processing emotions around surgery, family relationship, and work concerns. Chaplain created a safe space and provided active listening and pastoral presence.

## 2018-06-14 NOTE — Anesthesia Preprocedure Evaluation (Signed)
Anesthesia Evaluation  Patient identified by MRN, date of birth, ID band Patient awake    Reviewed: Allergy & Precautions, NPO status , Patient's Chart, lab work & pertinent test results, reviewed documented beta blocker date and time   Airway Mallampati: II  TM Distance: >3 FB     Dental  (+) Chipped   Pulmonary Current Smoker,           Cardiovascular      Neuro/Psych    GI/Hepatic   Endo/Other    Renal/GU Renal InsufficiencyRenal disease     Musculoskeletal   Abdominal   Peds  Hematology   Anesthesia Other Findings   Reproductive/Obstetrics                             Anesthesia Physical Anesthesia Plan  ASA: II  Anesthesia Plan: General   Post-op Pain Management:    Induction: Intravenous  PONV Risk Score and Plan:   Airway Management Planned: Oral ETT and LMA  Additional Equipment:   Intra-op Plan:   Post-operative Plan:   Informed Consent: I have reviewed the patients History and Physical, chart, labs and discussed the procedure including the risks, benefits and alternatives for the proposed anesthesia with the patient or authorized representative who has indicated his/her understanding and acceptance.     Plan Discussed with: CRNA  Anesthesia Plan Comments:         Anesthesia Quick Evaluation

## 2018-06-14 NOTE — Consult Note (Signed)
Urology Consult  I have been asked to see the patient by Dr. Nemiah Commander, for evaluation and management of right ureteral stone.  Chief Complaint: right flank pain  History of Present Illness: Cameron Freeman is a 37 y.o. year old male with personal history of nephrolithiasis who presented to the emergency room yesterday evening with severe right-sided flank pain, nausea and vomiting.  The patient reports that he is been having pain since Wednesday.  It did not acutely worsen until the weekend when it got progressively worse to the point where he presented to the emergency room.  He denies any fevers or chills.  No voiding symptoms other than lower abdominal discomfort.  In the emergency room, he underwent noncontrast CT scan which revealed an 8 mm 6 mm obstructing right ureteral calculus with moderate hydronephrosis.  In addition, he was noted to have acute kidney injury, creatinine 2.3 from his previous baseline of 1.15 in 2017.  No leukocytosis, urine with red blood cells, otherwise no evidence of infection.  He is admitted to the medicine service for consideration of operative intervention today.  He does have personal history of kidney stones requiring lithotripsy.  He is previously followed by Dr. Sherryl Barters.  He is also had a metabolic stone evaluation in 2017 with elevated urinary calcium levels.  He was offered indapamide but is not currently taking this medication.  Stone composition previously 85% calcium oxalate, 15% calcium phosphate.    Past Medical History:  Diagnosis Date  . Kidney stones   . Microscopic hematuria     Past Surgical History:  Procedure Laterality Date  . EXTRACORPOREAL SHOCK WAVE LITHOTRIPSY Left 09/20/2015   Procedure: EXTRACORPOREAL SHOCK WAVE LITHOTRIPSY (ESWL);  Surgeon: Hildred Laser, MD;  Location: [facility] ORS;  Service: Urology;  Laterality: Left;  . NO PAST SURGERIES    . none      Home Medications:  No outpatient medications have been marked  as taking for the 06/13/18 encounter [facility] Encounter).    Allergies: No Known Allergies  Family History  Problem Relation Age of Onset  . Kidney disease Maternal Aunt        dialysis  . Lung cancer Father   . Kidney disease Mother        one kidney  . Kidney Stones Unknown   . Prostate cancer Neg Hx     Social History:  reports that he has been smoking cigars. He has never used smokeless tobacco. He reports that he drinks alcohol. He reports that he has current or past drug history. Drug: Marijuana.  ROS: A complete review of systems was performed.  All systems are negative except for pertinent findings as noted.  Physical Exam:  Vital signs in last 24 hours: Temp:  [98.2 F (36.8 C)-98.3 F (36.8 C)] 98.2 F (36.8 C) (11/18 0519) Pulse Rate:  [63-72] 63 (11/18 0519) Resp:  [17-19] 19 (11/18 0519) BP: (140-196)/(88-123) 140/99 (11/18 0519) SpO2:  [97 %-100 %] 97 % (11/18 0519) Weight:  [92.9 kg-93 kg] 92.9 kg (11/17 2212) Constitutional:  Alert and oriented, No acute distress.  Family members present. HEENT: [place] AT, moist mucus membranes.  Trachea midline, no masses Cardiovascular: No lower extremity edema Respiratory: Normal respiratory effort, no increased work of breathing GI: Abdomen is soft, nontender, nondistended, no abdominal masses GU: Right CVA tenderness as well suprapubic tenderness Skin: No rashes, bruises or suspicious lesions Neurologic: Grossly intact, no focal deficits, moving all 4 extremities Psychiatric: Normal mood and affect  Laboratory Data:  Recent Labs    06/13/18 1345 06/14/18 0513  WBC 9.2 8.8  HGB 16.0 14.6  HCT 46.7 43.2   Recent Labs    06/13/18 1345 06/14/18 0513  NA 139 137  K 4.5 4.0  CL 103 107  CO2 25 24  GLUCOSE 115* 91  BUN 22* 22*  CREATININE 2.30* 2.45*  CALCIUM 9.3 8.3*   Component     Latest Ref Rng & Units 06/13/2018  Color, Urine     YELLOW YELLOW (A)  Appearance     CLEAR CLEAR (A)  Glucose, UA      NEGATIVE mg/dL NEGATIVE  Bilirubin Urine     NEGATIVE NEGATIVE  Ketones, ur     NEGATIVE mg/dL 20 (A)  Specific Gravity, Urine     1.005 - 1.030 1.017  Hgb urine dipstick     NEGATIVE LARGE (A)  pH     5.0 - 8.0 5.0  Protein     NEGATIVE mg/dL 098100 (A)  Nitrite     NEGATIVE NEGATIVE  Leukocytes, UA     NEGATIVE NEGATIVE  RBC / HPF     0 - 5 RBC/hpf >50 (H)  WBC, UA     0 - 5 WBC/hpf 0-5  Bacteria, UA     NONE SEEN RARE (A)  Squamous Epithelial / LPF     0 - 5 0-5  Mucus      PRESENT    Radiologic Imaging: Ct Renal Stone Study  Result Date: 06/13/2018 CLINICAL DATA:  Bilateral flank pain for 3 days history of chronic kidney stone EXAM: CT ABDOMEN AND PELVIS WITHOUT CONTRAST TECHNIQUE: Multidetector CT imaging of the abdomen and pelvis was performed following the standard protocol without IV contrast. COMPARISON:  CT 09/06/2015, radiograph 04/14/2016 FINDINGS: Lower chest: No acute abnormality. Hepatobiliary: No focal liver abnormality is seen. No gallstones, gallbladder wall thickening, or biliary dilatation. Pancreas: Unremarkable. No pancreatic ductal dilatation or surrounding inflammatory changes. Spleen: Normal in size without focal abnormality. Adrenals/Urinary Tract: Adrenal glands are normal. Multiple bilateral intrarenal calculi, right greater than left. Stones on the right measure up to 6 mm in size. Stones on the left measure up to 3 mm in size. Mild to moderate right hydronephrosis and hydroureter, secondary to 2 stones within the right ureter. More cephalad stone measures 6 mm and is in the proximal right ureter about 1.5 cm distal to the right UPJ. About 1.5 cm caudal to this is a second larger 8 mm stone within the proximal third of the ureter. Mild left renal pelvis dilatation without ureteral stone. Probable cyst midpole left kidney. Bladder unremarkable Stomach/Bowel: Stomach is within normal limits. Appendix appears normal. No evidence of bowel wall thickening,  distention, or inflammatory changes. Vascular/Lymphatic: Mild aortic atherosclerosis without aneurysm. No enlarged abdominal or pelvic lymph nodes. Reproductive: Prostate is unremarkable. Other: Negative for free air or free fluid. Small fat in the umbilical region. Musculoskeletal: No acute or significant osseous findings. IMPRESSION: 1. Moderate right hydronephrosis and hydroureter, secondary to 2 stones in the proximal right ureter as described above. 2. There are numerous bilateral intrarenal calculi. Electronically Signed   By: Jasmine PangKim  Fujinaga M.D.   On: 06/13/2018 17:38   CT scan personally reviewed today.  Agree with radiologic interpretation.  Impression/Plan: 37 year old male with obstructing ureteral calculus on the right x2, acute kidney injury with refractory pain admitted for consideration of possible intervention.  Given the size and location of the stones as well as difficult to control  pain, I do think it is reasonable to proceed with surgical intervention.  He was offered stent placement today with consideration of possible ureteroscopy/lithotripsy based on intraoperative findings.  Risks and benefits of ureteroscopy were reviewed including but not limited to infection, bleeding, pain, ureteral injury which could require open surgery versus prolonged indwelling if ureteral perforation occurs, persistent stone disease, requirement for staged procedure, possible stent, and global anesthesia risks. Patient expressed understanding and desires to proceed with ureteroscopy.  -NPO -Continue IV fluids -Consent obtained verbally, will place order for written -All questions answered -Patient understands that he is an add-on for the procedure today and may be bumped for true emergencies   06/14/2018, 9:56 AM  Vanna Scotland,  MD

## 2018-06-14 NOTE — Anesthesia Post-op Follow-up Note (Signed)
Anesthesia QCDR form completed.        

## 2018-06-14 NOTE — Anesthesia Postprocedure Evaluation (Signed)
Anesthesia Post Note  Patient: [name]  Procedure(s) Performed: CYSTOSCOPY WITH STENT PLACEMENT (Right ) URETEROSCOPY WITH HOLMIUM LASER LITHOTRIPSY (Right )  Patient location during evaluation: PACU Anesthesia Type: General Level of consciousness: awake and alert Pain management: pain level controlled Vital Signs Assessment: post-procedure vital signs reviewed and stable Respiratory status: spontaneous breathing, nonlabored ventilation, respiratory function stable and patient connected to nasal cannula oxygen Cardiovascular status: blood pressure returned to baseline and stable Postop Assessment: no apparent nausea or vomiting Anesthetic complications: no     Last Vitals:  Vitals:   06/14/18 1856 06/14/18 1906  BP: (!) 162/99 (!) 155/98  Pulse: 88   Resp: 12 14  Temp: 36.6 C   SpO2: 98% 97%    Last Pain:  Vitals:   06/14/18 1856  TempSrc:   PainSc: 2                  [name] S

## 2018-06-14 NOTE — Op Note (Signed)
Date of procedure: 06/14/18  Preoperative diagnosis:  1. Right ureteral stone x2 2. Right nonobstructing renal calculi  3. Acute kidney injury/right hydronephrosis  Postoperative diagnosis:  1. Same as above  Procedure: 1. Right ureteroscopy 2. Laser lithotripsy 3. Right ureteral stent placement 4. Right basket extraction of stone fragment 5. Right retrograde pyelogram  Surgeon: Vanna ScotlandAshley Monea Pesantez, MD  Anesthesia: General  Complications: None  Intraoperative findings: Ureteral calculi treated as well as all nonobstructing stone burden.  Procedure uncomplicated.  Stent left in place, no tether.  EBL: Minimal  Specimens: Stone fragment  Drains: 6 x 26 French double-J ureteral stent on right  Indication: Cameron Freeman is a 37 y.o. patient with obstructing ureteral calculi x2 with refractory pain and acute kidney injury.  After reviewing the management options for treatment, he elected to proceed with the above surgical procedure(s). We have discussed the potential benefits and risks of the procedure, side effects of the proposed treatment, the likelihood of the patient achieving the goals of the procedure, and any potential problems that might occur during the procedure or recuperation. Informed consent has been obtained.  Description of procedure:  The patient was taken to the operating room and general anesthesia was induced.  The patient was placed in the dorsal lithotomy position, prepped and draped in the usual sterile fashion, and preoperative antibiotics were administered. A preoperative time-out was performed.   A 21 French scope was advanced per urethra into the bladder.  Attention was turned to the right ureteral orifice which was cannulated using a 5 [place]French open-ended ureteral catheter.  Gentle retrograde pyelogram was performed showing a filling defect within the mid and proximal ureter as well as hydroureteronephrosis to the level of the first stone.  A wire was then placed  up to level the kidney without difficulty.  This was snapped in place a safety wire.  A 4.5 French semirigid ureteroscope was then advanced to the level of the mid proximal ureter where the first stone was encountered.  Using a 273 m laser fiber the settings of 0.8 J and 10 Hz, the stone was fragmented into multiple smaller pieces.  These pieces then retropulsed back up into the renal pelvis.  It was able to advance the scope to the other stone which also retropulsed up into the kidney.  A Super Stiff wire was then advanced to level the kidney under direct vision.  A Cook 12/14 French access sheath was then advanced to the proximal ureter over the Super Stiff wire and the inner cannula and wire were removed.  A flexible 7 French ureteroscope was then advanced to the kidney where multiple stones were encountered and further fragmented.  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  A 1.9 French tipless nitinol basket was then used to extract the remainder of the stone fragment.  Some of the stones were too large and required further fragmentation.  Ultimately, I was able to clear his upper tract of all significant stone burden.  A final retrograde pyelogram was performed crating a roadmap of the kidney to ensure that each every calyx was directly visualized.  The scope was then backed down the length the ureter inspecting along the way.  There are no ureteral injuries appreciated although there was some edema and friable mucosa at the level of the previous stones.  There is no significant residual stone perhaps fragments.  The safety wire was then backloaded over rigid cystoscope.  A 6 x 26 French double-J ureteral stent was then advanced over the wire up to level of the kidney.  The wire was partially drawn until full course of within the renal pelvis and ultimately fully withdrawn a focal stone within the bladder.  The bladder was  then drained.  The scope was then removed.  The patient was then clean and dry, repositioned supine position, reversed from anesthesia, taken to PACU in stable condition.  Plan: Patient may be discharged tomorrow.  We will follow-up creatinine.  Will arrange for cystoscopy, stent removal.  He should be discharged home with pain meds, oxybutynin, and Flomax for stent pain.  Vanna Scotland, M.D.

## 2018-06-14 NOTE — Progress Notes (Signed)
[employer] [facility] [place] [facility]   PATIENT NAME: Cameron Freeman    MR#:  [id number]  DATE OF BIRTH:  06-07-1981  SUBJECTIVE:  CHIEF COMPLAINT:   Chief Complaint  Patient presents with  . Flank Pain    right and left   -Complains of right flank pain.  Going for stent placement later today. -Renal function still remains worse  REVIEW OF SYSTEMS:  Review of Systems  Constitutional: Negative for chills, fever and malaise/fatigue.  HENT: Negative for congestion, hearing loss and nosebleeds.   Eyes: Negative for blurred vision and double vision.  Respiratory: Negative for cough, shortness of breath and wheezing.   Cardiovascular: Negative for chest pain and palpitations.  Gastrointestinal: Negative for abdominal pain, constipation, diarrhea, nausea and vomiting.  Genitourinary: Positive for flank pain. Negative for dysuria.  Musculoskeletal: Negative for myalgias.  Neurological: Negative for dizziness, focal weakness, seizures, weakness and headaches.  Psychiatric/Behavioral: Negative for depression.    DRUG ALLERGIES:  No Known Allergies  VITALS:  Blood pressure (!) 140/99, pulse 63, temperature 98.2 F (36.8 C), temperature source Oral, resp. rate 19, height 5\' 9"  (1.753 m), weight 92.9 kg, SpO2 97 %.  PHYSICAL EXAMINATION:  Physical Exam  GENERAL:  37 y.o.-year-old patient lying in the bed with no acute distress.  EYES: Pupils equal, round, reactive to light and accommodation. No scleral icterus. Extraocular muscles intact.  HEENT: Head atraumatic, normocephalic. Oropharynx and nasopharynx clear.  NECK:  Supple, no jugular venous distention. No thyroid enlargement, no tenderness.  LUNGS: Normal breath sounds bilaterally, no wheezing, rales,rhonchi or crepitation. No use of accessory muscles of respiration.  CARDIOVASCULAR: S1, S2 normal. No murmurs, rubs, or gallops.  ABDOMEN: Soft, diffuse tenderness in the  periumbilical and right flank region  without voluntary guarding or rigidity,, nondistended. Bowel sounds present. No organomegaly or mass.  EXTREMITIES: No pedal edema, cyanosis, or clubbing.  NEUROLOGIC: Cranial nerves II through XII are intact. Muscle strength 5/5 in all extremities. Sensation intact. Gait not checked.  PSYCHIATRIC: The patient is alert and oriented x 3.  SKIN: No obvious rash, lesion, or ulcer.     LABORATORY PANEL:   CBC Recent Labs  Lab 06/14/18 0513  WBC 8.8  HGB 14.6  HCT 43.2  PLT 179   ------------------------------------------------------------------------------------------------------------------  Chemistries  Recent Labs  Lab 06/14/18 0513  NA 137  K 4.0  CL 107  CO2 24  GLUCOSE 91  BUN 22*  CREATININE 2.45*  CALCIUM 8.3*   ------------------------------------------------------------------------------------------------------------------  Cardiac Enzymes No results for input(s): TROPONINI in the last 168 hours. ------------------------------------------------------------------------------------------------------------------  RADIOLOGY:  Ct Renal Stone Study  Result Date: 06/13/2018 CLINICAL DATA:  Bilateral flank pain for 3 days history of chronic kidney stone EXAM: CT ABDOMEN AND PELVIS WITHOUT CONTRAST TECHNIQUE: Multidetector CT imaging of the abdomen and pelvis was performed following the standard protocol without IV contrast. COMPARISON:  CT 09/06/2015, radiograph 04/14/2016 FINDINGS: Lower chest: No acute abnormality. Hepatobiliary: No focal liver abnormality is seen. No gallstones, gallbladder wall thickening, or biliary dilatation. Pancreas: Unremarkable. No pancreatic ductal dilatation or surrounding inflammatory changes. Spleen: Normal in size without focal abnormality. Adrenals/Urinary Tract: Adrenal glands are normal. Multiple bilateral intrarenal calculi, right greater than left. Stones on the right measure up to 6 mm in size. Stones on the left measure up to 3 mm in  size. Mild to moderate right hydronephrosis and hydroureter, secondary to 2 stones within the right ureter. More cephalad stone measures 6 mm and is in the proximal  right ureter about 1.5 cm distal to the right UPJ. About 1.5 cm caudal to this is a second larger 8 mm stone within the proximal third of the ureter. Mild left renal pelvis dilatation without ureteral stone. Probable cyst midpole left kidney. Bladder unremarkable Stomach/Bowel: Stomach is within normal limits. Appendix appears normal. No evidence of bowel wall thickening, distention, or inflammatory changes. Vascular/Lymphatic: Mild aortic atherosclerosis without aneurysm. No enlarged abdominal or pelvic lymph nodes. Reproductive: Prostate is unremarkable. Other: Negative for free air or free fluid. Small fat in the umbilical region. Musculoskeletal: No acute or significant osseous findings. IMPRESSION: 1. Moderate right hydronephrosis and hydroureter, secondary to 2 stones in the proximal right ureter as described above. 2. There are numerous bilateral intrarenal calculi. Electronically Signed   By: Jasmine PangKim  Fujinaga M.D.   On: 06/13/2018 17:38    EKG:  No orders found for this or any previous visit.  ASSESSMENT AND PLAN:   Cameron Freeman  is a 37 y.o. male with a known history of kidney stones requiring lithotripsy presents to hospital secondary to abdominal pain, nausea and right flank pain for 3 days now.  1.  Nephrolithiasis-with proximal ureteral stones on right side and right sided hydronephrosis -Appreciate urology consult.  For ureteroscopy and possible stent placement today. -Continue IV fluids. -Full bilateral intrarenal calculi noted.  Prior calcium oxalate stones. -IV Rocephin for now and urine cultures  2.  Acute renal failure-likely secondary to obstruction and hydronephrosis. -Also could have mild CKD from undiagnosed hypertension.  Continue monitoring renal function after the procedure today.  Slight worsening noted on labs  today -IV fluids and continue to monitor  3.  Elevated lipase-patient does have abdominal pain, I will could be reactive elevation.  He does drink alcohol.  Continue to monitor for now.    4.  Hypertension-could have undiagnosed hypertension.  Also from underlying pain now. -Add low-dose Norvasc - IV hydralazine PRN for now.  5.  Polysubstance abuse-counseled for now  6.  DVT Prophylaxis- Lovenox after his procedure      All the records are reviewed and case discussed with Care Management/Social Workerr. Management plans discussed with the patient, family and they are in agreement.  CODE STATUS: Full Code  TOTAL TIME TAKING CARE OF THIS PATIENT: 38 minutes.   POSSIBLE D/C IN 1-2 DAYS, DEPENDING ON CLINICAL CONDITION.   Enid BaasKALISETTI,Trice Aspinall M.D on 06/14/2018 at 10:51 AM  Between 7am to 6pm - Pager - [phone]  After 6pm go to [email or web address] - Social research officer, governmentpassword EPAS [email or web address]  [facility]  Office  [phone]  CC: Primary care physician; [facility]

## 2018-06-14 NOTE — Transfer of Care (Signed)
Immediate Anesthesia Transfer of Care Note  Patient: Cameron Freeman  Procedure(s) Performed: CYSTOSCOPY WITH STENT PLACEMENT (Right ) URETEROSCOPY WITH HOLMIUM LASER LITHOTRIPSY (Right )  Patient Location: PACU  Anesthesia Type:General  Level of Consciousness: awake, alert  and oriented  Airway & Oxygen Therapy: Patient Spontanous Breathing  Post-op Assessment: Report given to RN and Post -op Vital signs reviewed and stable  Post vital signs: Reviewed  Last Vitals:  Vitals Value Taken Time  BP 212/137 06/14/2018  5:59 PM  Temp 37.1 C 06/14/2018  5:56 PM  Pulse 113 06/14/2018  6:00 PM  Resp 20 06/14/2018  6:00 PM  SpO2 99 % 06/14/2018  6:00 PM  Vitals shown include unvalidated device data.  Last Pain:  Vitals:   06/14/18 1756  TempSrc:   PainSc: 0-No pain      Patients Stated Pain Goal: 0 (06/14/18 0406)  Complications: No apparent anesthesia complications

## 2018-06-14 NOTE — Discharge Instructions (Signed)
You have a ureteral stent in place.  This is a tube that extends from your kidney to your bladder.  This may cause urinary bleeding, burning with urination, and urinary frequency.  Please call our office or present to the ED if you develop fevers >101 or pain which is not able to be controlled with oral pain medications.  You may be given either Flomax and/ or ditropan to help with bladder spasms and stent pain in addition to pain medications.    [facility] [address], [address] [place], [place] [address] [phone] 

## 2018-06-14 NOTE — Anesthesia Procedure Notes (Signed)
Procedure Name: Intubation Performed by: Mathews ArgyleLogan, Antha Niday, CRNA Pre-anesthesia Checklist: Patient identified, Patient being monitored, Timeout performed, Emergency Drugs available and Suction available Patient Re-evaluated:Patient Re-evaluated prior to induction Oxygen Delivery Method: Circle system utilized Preoxygenation: Pre-oxygenation with 100% oxygen Induction Type: IV induction and Rapid sequence Laryngoscope Size: Miller and 2 Grade View: Grade I Tube type: Oral Tube size: 8.0 mm Number of attempts: 1 Airway Equipment and Method: Stylet Placement Confirmation: ETT inserted through vocal cords under direct vision,  positive ETCO2 and breath sounds checked- equal and bilateral Secured at: 22 cm Tube secured with: Tape Dental Injury: Teeth and Oropharynx as per pre-operative assessment

## 2018-06-15 ENCOUNTER — Telehealth: Payer: Self-pay | Admitting: Urology

## 2018-06-15 ENCOUNTER — Encounter: Payer: Self-pay | Admitting: Urology

## 2018-06-15 LAB — URINE CULTURE: CULTURE: NO GROWTH

## 2018-06-15 LAB — BASIC METABOLIC PANEL
Anion gap: 8 (ref 5–15)
BUN: 20 mg/dL (ref 6–20)
CALCIUM: 8.9 mg/dL (ref 8.9–10.3)
CO2: 26 mmol/L (ref 22–32)
CREATININE: 1.93 mg/dL — AB (ref 0.61–1.24)
Chloride: 105 mmol/L (ref 98–111)
GFR calc Af Amer: 50 mL/min — ABNORMAL LOW (ref >60)
GFR calc non Af Amer: 43 mL/min — ABNORMAL LOW (ref >60)
GLUCOSE: 130 mg/dL — AB (ref 70–99)
Potassium: 4.7 mmol/L (ref 3.5–5.1)
Sodium: 139 mmol/L (ref 135–145)

## 2018-06-15 LAB — LIPASE, BLOOD: Lipase: 25 U/L (ref 11–51)

## 2018-06-15 LAB — HIV ANTIBODY (ROUTINE TESTING W REFLEX): HIV Screen 4th Generation wRfx: NONREACTIVE

## 2018-06-15 MED ORDER — OXYCODONE HCL 5 MG PO TABS: 5.0000 mg | ORAL_TABLET | Freq: Four times a day (QID) | ORAL | 0 refills | Status: DC | PRN

## 2018-06-15 MED ORDER — AMLODIPINE BESYLATE 5 MG PO TABS: 5.0000 mg | ORAL_TABLET | Freq: Every day | ORAL | 2 refills | Status: DC

## 2018-06-15 MED ORDER — TAMSULOSIN HCL 0.4 MG PO CAPS: 0.4000 mg | ORAL_CAPSULE | Freq: Every day | ORAL | 2 refills | Status: DC

## 2018-06-15 MED ORDER — OXYBUTYNIN CHLORIDE 5 MG PO TABS: 5.0000 mg | ORAL_TABLET | Freq: Three times a day (TID) | ORAL | 0 refills | Status: DC

## 2018-06-15 MED ORDER — OXYBUTYNIN CHLORIDE 5 MG PO TABS
5.0000 mg | ORAL_TABLET | Freq: Three times a day (TID) | ORAL | Status: DC
  Administered 2018-06-15: 5 mg via ORAL
  Filled 2018-06-15 (×2): qty 1

## 2018-06-15 MED ORDER — TAMSULOSIN HCL 0.4 MG PO CAPS
0.4000 mg | ORAL_CAPSULE | Freq: Every day | ORAL | Status: DC
  Administered 2018-06-15: 0.4 mg via ORAL
  Filled 2018-06-15: qty 1

## 2018-06-15 NOTE — Discharge Summary (Signed)
[employer] - [place] at [facility]   PATIENT NAME: Cameron Freeman    MR#:  [id number]  DATE OF BIRTH:  1981/03/20  DATE OF ADMISSION:  06/13/2018   ADMITTING PHYSICIAN: Enid Baas, MD  DATE OF DISCHARGE: 06/15/18  PRIMARY CARE PHYSICIAN: [facility]   ADMISSION DIAGNOSIS:   Kidney stone [N20.0] Right flank pain [R10.9] AKI (acute kidney injury) (HCC) [N17.9] Hydronephrosis with urinary obstruction due to ureteral calculus [N13.2]  DISCHARGE DIAGNOSIS:   Active Problems:   Hydronephrosis   SECONDARY DIAGNOSIS:   Past Medical History:  Diagnosis Date  . Kidney stones   . Microscopic hematuria     HOSPITAL COURSE:   [name] a37 y.o.malewith a known history of kidney stones requiring lithotripsy presents to hospital secondary to abdominal pain, nausea and right flank pain for 3 days now.  1. Nephrolithiasis-with proximal ureteral stones on right side and right sided hydronephrosis -Appreciate urology consult.  -Patient is status post right ureteroscopy with laser lithotripsy and ureteral stent placement. -Started on Flomax and oxybutynin.  Continue pain control. -CT with bilateral intrarenal calculi noted.  Prior calcium oxalate stones. -No infection, discontinue antibiotics. -Follow-up with urology in 1 week for repeat cystoscopy and stent removal  2. Acute renal failure-likely secondary to obstruction and hydronephrosis. -Also could have mild CKD from undiagnosed hypertension.  -  -IV fluids also received during the hospital stay.  Creatinine improved up to 1.9 at discharge after stent placement.  Continue outpatient follow-up  3. Elevated lipase-reactive elevation.  Normalized now.  4. Hypertension- has undiagnosed hypertension.  Also from underlying pain now. -Added low-dose Norvasc -Also lifestyle changes discussed.  Advised to follow-up with PCP within 1 week  5. Polysubstance  abuse-counseled for now  Up and ambulatory.  Will be discharged today  DISCHARGE CONDITIONS:   Guarded  CONSULTS OBTAINED:   Treatment Team:  Crist Fat, MD  DRUG ALLERGIES:   No Known Allergies DISCHARGE MEDICATIONS:   Allergies as of 06/15/2018   No Known Allergies     Medication List    STOP taking these medications   indapamide 2.5 MG tablet Commonly known as:  LOZOL     TAKE these medications   amLODipine 5 MG tablet Commonly known as:  NORVASC Take 1 tablet (5 mg total) by mouth daily.   oxybutynin 5 MG tablet Commonly known as:  DITROPAN Take 1 tablet (5 mg total) by mouth 3 (three) times daily.   oxyCODONE 5 MG immediate release tablet Commonly known as:  Oxy IR/ROXICODONE Take 1 tablet (5 mg total) by mouth every 6 (six) hours as needed for moderate pain or severe pain.   tamsulosin 0.4 MG Caps capsule Commonly known as:  FLOMAX Take 1 capsule (0.4 mg total) by mouth daily.        DISCHARGE INSTRUCTIONS:   1.  PCP follow-up in 1 to 2 weeks 2.  Urology follow-up in 1 week  DIET:   Cardiac diet  ACTIVITY:   Activity as tolerated  OXYGEN:   Home Oxygen: No.  Oxygen Delivery: room air  DISCHARGE LOCATION:   home   If you experience worsening of your admission symptoms, develop shortness of breath, life threatening emergency, suicidal or homicidal thoughts you must seek medical attention immediately by calling 911 or calling your MD immediately  if symptoms less severe.  You Must read complete instructions/literature along with all the possible adverse reactions/side effects for all the Medicines you take and that have been  prescribed to you. Take any new Medicines after you have completely understood and accpet all the possible adverse reactions/side effects.   Please note  You were cared for by a hospitalist during your hospital stay. If you have any questions about your discharge medications or the care you received while  you were in the hospital after you are discharged, you can call the unit and asked to speak with the hospitalist on call if the hospitalist that took care of you is not available. Once you are discharged, your primary care physician will handle any further medical issues. Please note that NO REFILLS for any discharge medications will be authorized once you are discharged, as it is imperative that you return to your primary care physician (or establish a relationship with a primary care physician if you do not have one) for your aftercare needs so that they can reassess your need for medications and monitor your lab values.    On the day of Discharge:  VITAL SIGNS:   Blood pressure (!) 146/103, pulse (!) 58, temperature 97.8 F (36.6 C), temperature source Oral, resp. rate 20, height 5\' 9"  (1.753 m), weight 92.9 kg, SpO2 97 %.  PHYSICAL EXAMINATION:   GENERAL:37 y.o.-year-old patient lying in the bed with no acute distress.  EYES: Pupils equal, round, reactive to light and accommodation. No scleral icterus. Extraocular muscles intact.  HEENT: Head atraumatic, normocephalic. Oropharynx and nasopharynx clear.  NECK: Supple, no jugular venous distention. No thyroid enlargement, no tenderness.  LUNGS: Normal breath sounds bilaterally, no wheezing, rales,rhonchi or crepitation. No use of accessory muscles of respiration.  CARDIOVASCULAR: S1, S2 normal. No murmurs, rubs, or gallops.  ABDOMEN: Soft,improved tenderness in the  periumbilical and right flank region without voluntary guarding or rigidity,, nondistended. Bowel sounds present. No organomegaly or mass.  EXTREMITIES: No pedal edema, cyanosis, or clubbing.  NEUROLOGIC: Cranial nerves II through XII are intact. Muscle strength 5/5 in all extremities. Sensation intact. Gait not checked.  PSYCHIATRIC: The patient is alert and oriented x 3.  SKIN: No obvious rash, lesion, or ulcer.    DATA REVIEW:   CBC Recent Labs  Lab 06/14/18 0513    WBC 8.8  HGB 14.6  HCT 43.2  PLT 179    Chemistries  Recent Labs  Lab 06/15/18 0328  NA 139  K 4.7  CL 105  CO2 26  GLUCOSE 130*  BUN 20  CREATININE 1.93*  CALCIUM 8.9     Microbiology Results  Results for orders placed or performed during the hospital encounter of 06/13/18  Urine Culture     Status: None   Collection Time: 06/13/18  1:45 PM  Result Value Ref Range Status   Specimen Description   Final    URINE, RANDOM Performed at [facility], [address] [place], [place] [address]    Special Requests   Final    NONE Performed at [facility], [address] [place], [place] [address]    Culture   Final    NO GROWTH Performed at [facility], [address] [place], [place] [address]    Report Status 06/15/2018 FINAL  Final    RADIOLOGY:  No results found.   Management plans discussed with the patient, family and they are in agreement.  CODE STATUS:     Code Status Orders  (From admission, onward)         Start     Ordered   06/13/18 2143  Full code  Continuous     06/13/18 2142        Code Status History    This patient has a current code status but no historical code status.      TOTAL TIME TAKING CARE OF THIS PATIENT: 38 minutes.    Enid Baas M.D on 06/15/2018 at 9:32 AM  Between 7am to 6pm - Pager - [phone]  After 6pm go to [email or web address] - Scientist, research (life sciences) [facility]  Office  [phone]  CC: Primary care physician; [facility]   Note: This dictation was prepared with Nurse, children's dictation along with smaller phrase technology. Any transcriptional errors that result from this process are unintentional.

## 2018-06-15 NOTE — Care Management (Signed)
Patient discharged home today. Patient confirms he is active with [facility]  Patient request that his prescriptions be sent to [facility], and coupons from goodrx be printed for [facility]  Norvasc $13.91, Ditropan $26.59, Oxy $8.11, and Flomax $17.10.  Patient provided coupons, denies issues obtaining medications.  RNCM signing off.

## 2018-06-15 NOTE — Progress Notes (Signed)
06/15/2018 10:52 AM  Ra Leite to be D/C'd Home per MD order.  Discussed prescriptions and follow up appointments with the patient. Prescriptions given to patient, medication list explained in detail. Pt verbalized understanding.  Allergies as of 06/15/2018   No Known Allergies     Medication List    STOP taking these medications   indapamide 2.5 MG tablet Commonly known as:  LOZOL     TAKE these medications   amLODipine 5 MG tablet Commonly known as:  NORVASC Take 1 tablet (5 mg total) by mouth daily.   oxybutynin 5 MG tablet Commonly known as:  DITROPAN Take 1 tablet (5 mg total) by mouth 3 (three) times daily.   oxyCODONE 5 MG immediate release tablet Commonly known as:  Oxy IR/ROXICODONE Take 1 tablet (5 mg total) by mouth every 6 (six) hours as needed for moderate pain or severe pain.   tamsulosin 0.4 MG Caps capsule Commonly known as:  FLOMAX Take 1 capsule (0.4 mg total) by mouth daily.       Vitals:   06/15/18 0607 06/15/18 1045  BP: (!) 146/103 (!) 156/100  Pulse: (!) 58 62  Resp: 20   Temp: 97.8 F (36.6 C)   SpO2: 97%     Skin clean, dry and intact without evidence of skin break down, no evidence of skin tears noted. IV catheter discontinued intact. Site without signs and symptoms of complications. Dressing and pressure applied. Pt denies pain at this time. No complaints noted.  An After Visit Summary was printed and given to the patient. Patient escorted via WC, and D/C home via private auto.  Madie RenoMisty D Allesandra Huebsch, RN

## 2018-06-15 NOTE — Telephone Encounter (Signed)
App made ° ° °Cameron Freeman  °

## 2018-06-18 ENCOUNTER — Telehealth: Payer: Self-pay

## 2018-06-18 NOTE — Telephone Encounter (Signed)
Flagged on EMMI report for not knowing who to call about changes in condition and not having a follow up scheduled.  Called and spoke with patient.  Reviewed AVS and encouraged him to reach out to [facility] or [facility] for any questions or concerns he may have.  He confirms having a follow up appointment with Dr. Apolinar JunesBrandon next week. Patient wanted to know when he could resume sexual activity.  Encouraged him to discuss with Dr. Apolinar JunesBrandon during appointment or by calling office.  No further questions or concerns.  I thanked him for his time and informed him that he would receive one more automated call checking in over the next few days.

## 2018-06-21 LAB — STONE ANALYSIS
CA OXALATE, DIHYDRATE: 2 %
CA PHOS CRY STONE QL IR: 5 %
Ca Oxalate,Monohydr.: 93 %
Stone Weight KSTONE: 30 mg

## 2018-06-22 ENCOUNTER — Encounter: Payer: Self-pay | Admitting: Urology

## 2018-06-22 ENCOUNTER — Ambulatory Visit (INDEPENDENT_AMBULATORY_CARE_PROVIDER_SITE_OTHER): Payer: Self-pay | Admitting: Urology

## 2018-06-22 VITALS — BP 124/85 | HR 74 | Ht 69.0 in | Wt 205.0 lb

## 2018-06-22 DIAGNOSIS — N1330 Unspecified hydronephrosis: Secondary | ICD-10-CM

## 2018-06-22 DIAGNOSIS — N201 Calculus of ureter: Secondary | ICD-10-CM

## 2018-06-22 LAB — URINALYSIS, COMPLETE
BILIRUBIN UA: NEGATIVE
Glucose, UA: NEGATIVE
Ketones, UA: NEGATIVE
Nitrite, UA: NEGATIVE
Specific Gravity, UA: 1.02 (ref 1.005–1.030)
Urobilinogen, Ur: 1 mg/dL (ref 0.2–1.0)
pH, UA: 7 (ref 5.0–7.5)

## 2018-06-22 LAB — MICROSCOPIC EXAMINATION
EPITHELIAL CELLS (NON RENAL): NONE SEEN /HPF (ref 0–10)
RBC, UA: >30 /hpf — ABNORMAL HIGH (ref 0–2)

## 2018-06-22 MED ORDER — CIPROFLOXACIN HCL 500 MG PO TABS
500.0000 mg | ORAL_TABLET | Freq: Once | ORAL | Status: AC
  Administered 2018-06-22: 500 mg via ORAL

## 2018-06-22 NOTE — Patient Instructions (Addendum)
Phenazopyridine tablets (AZO)  What is this medicine? PHENAZOPYRIDINE (fen az oh PEER i deen) is a pain reliever. It is used to stop the pain, burning, or discomfort caused by infection or irritation of the urinary tract. This medicine is not an antibiotic. It will not cure a urinary tract infection. This medicine may be used for other purposes; ask your health care provider or pharmacist if you have questions. COMMON BRAND NAME(S): AZO, Azo-100, Azo-Gesic, Azo-Septic, Azo-Standard, Phenazo, Prodium, Pyridium, Urinary Analgesic, Uristat What should I tell my health care provider before I take this medicine? They need to know if you have any of these conditions: -glucose-6-phosphate dehydrogenase (G6PD) deficiency -kidney disease -an unusual or allergic reaction to phenazopyridine, other medicines, foods, dyes, or preservatives -pregnant or trying to get pregnant -breast-feeding How should I use this medicine? Take this medicine by mouth with a glass of water. Follow the directions on the prescription label. Take after meals. Take your doses at regular intervals. Do not take your medicine more often than directed. Do not skip doses or stop your medicine early even if you feel better. Do not stop taking except on your doctor's advice. Talk to your pediatrician regarding the use of this medicine in children. Special care may be needed. Overdosage: If you think you have taken too much of this medicine contact a poison control center or emergency room at once. NOTE: This medicine is only for you. Do not share this medicine with others. What if I miss a dose? If you miss a dose, take it as soon as you can. If it is almost time for your next dose, take only that dose. Do not take double or extra doses. What may interact with this medicine? Interactions are not expected. This list may not describe all possible interactions. Give your health care provider a list of all the medicines, herbs,  non-prescription drugs, or dietary supplements you use. Also tell them if you smoke, drink alcohol, or use illegal drugs. Some items may interact with your medicine. What should I watch for while using this medicine? Tell your doctor or health care professional if your symptoms do not improve or if they get worse. This medicine colors body fluids red. This effect is harmless and will go away after you are done taking the medicine. It will change urine to an dark orange or red color. The red color may stain clothing. Soft contact lenses may become permanently stained. It is best not to wear soft contact lenses while taking this medicine. If you are diabetic you may get a false positive result for sugar in your urine. Talk to your health care provider. What side effects may I notice from receiving this medicine? Side effects that you should report to your doctor or health care professional as soon as possible: -allergic reactions like skin rash, itching or hives, swelling of the face, lips, or tongue -blue or purple color of the skin -difficulty breathing -fever -less urine -unusual bleeding, bruising -unusual tired, weak -vomiting -yellowing of the eyes or skin Side effects that usually do not require medical attention (report to your doctor or health care professional if they continue or are bothersome): -dark urine -headache -stomach upset This list may not describe all possible side effects. Call your doctor for medical advice about side effects. You may report side effects to FDA at 1-800-FDA-1088. Where should I keep my medicine? Keep out of the reach of children. Store at room temperature between 15 and 30 degrees C (59  and 86 degrees F). Protect from light and moisture. Throw away any unused medicine after the expiration date. NOTE: This sheet is a summary. It may not cover all possible information. If you have questions about this medicine, talk to your doctor, pharmacist, or health  care provider.  2018 Elsevier/Gold Standard (2008-02-10 11:04:07)

## 2018-06-22 NOTE — Progress Notes (Signed)
   06/22/18  CC:  Chief Complaint  Patient presents with  . Cysto Stent Removal    HPI: 37 year old male who presented with obstructing right ureteral calculi x2 who underwent right ureteroscopy on 06/14/2018 he returns today for cystoscopy, stent replacement.  He is been doing well, tolerating the stent.  NED. A&Ox3.   No respiratory distress   Abd soft, NT, ND Normal phallus with bilateral descended testicles  Cystoscopy/ Stent removal procedure  Patient identification was confirmed, informed consent was obtained, and patient was prepped using Betadine solution.  Lidocaine jelly was administered per urethral meatus.    Preoperative abx where received prior to procedure.    Procedure: - Flexible cystoscope introduced, without any difficulty.   - Thorough search of the bladder revealed:    normal urethral meatus  Stent seen emanating from right ureteral orifice, grasped with stent graspers, and removed in entirety.     Post-Procedure: - Patient tolerated the procedure well    Post-Procedure: - Patient tolerated the procedure well  Assessment/ Plan:  1. Right ureteral stone Status post unconjugated ureteral stent removal today Plan for follow-up in 4 weeks to review stone diet/follow-up renal ultrasound/stone analysis Warning symptoms reviewed AZO for dysuria - Urinalysis, Complete - US RENAL; Future - ciprofloxacin (CIPRO) tablet 500 mg  2. Hydronephrosis of right kidney Follow-up in 4 weeks with renal ultrasound as above   Return in about 4 weeks (around 07/20/2018) for RUS.  Vanna ScotlandAshley Keyuana Wank, MD

## 2018-07-22 ENCOUNTER — Ambulatory Visit
Admission: RE | Admit: 2018-07-22 | Discharge: 2018-07-22 | Disposition: A | Discharge status: Home / Self Care | Payer: Self-pay | Source: Ambulatory Visit | Attending: Urology | Admitting: Urology

## 2018-07-22 DIAGNOSIS — N281 Cyst of kidney, acquired: Secondary | ICD-10-CM | POA: Insufficient documentation

## 2018-07-22 DIAGNOSIS — N200 Calculus of kidney: Secondary | ICD-10-CM | POA: Insufficient documentation

## 2018-07-22 DIAGNOSIS — N201 Calculus of ureter: Secondary | ICD-10-CM

## 2018-08-03 ENCOUNTER — Ambulatory Visit (INDEPENDENT_AMBULATORY_CARE_PROVIDER_SITE_OTHER): Payer: Self-pay | Admitting: Urology

## 2018-08-03 ENCOUNTER — Encounter: Payer: Self-pay | Admitting: Urology

## 2018-08-03 VITALS — BP 147/100 | HR 69 | Ht 69.0 in | Wt 205.0 lb

## 2018-08-03 DIAGNOSIS — N201 Calculus of ureter: Secondary | ICD-10-CM

## 2018-08-03 DIAGNOSIS — N1330 Unspecified hydronephrosis: Secondary | ICD-10-CM

## 2018-08-03 MED ORDER — INDAPAMIDE 2.5 MG PO TABS: 2.5000 mg | ORAL_TABLET | Freq: Every day | ORAL | 11 refills | Status: DC

## 2018-08-03 NOTE — Progress Notes (Signed)
08/03/2018  1:12 PM   Cameron Freeman [birth year] [id number]  Referring provider: [facility] [address] [place], [place] [address]  Chief Complaint  Patient presents with  . Hydronephrosis    4 week follow up with RUS    HPI: Cameron Freeman is a 38 y.o. male with history of obstructing ureteral calculus on the right x2 that presents today for a one month evaluation following his recent ureteral calculi, and discussion following his recent renal ultrasound results.  He was seen inpatient 05/2018 with right ureteral calculus x2 on the right. Was taken to OR for right ureteroscopy which was uncomplicated. Stent was subsiquently removed post op. He returns today with a follow up renal ultrasound with a resolution of right sided hydronephrosis.  He denies any ongoing flank pain.  No urinary symptoms today.  Stone analysis shows 93% calcium oxilate monohydrate, 2% calcium oxidate dihydrate, and 5% calcium phosphate.   He does have personal history of kidney stones requiring lithotripsy.  He is previously followed by Dr. Sherryl BartersBudzyn.  He is also had a metabolic stone evaluation in 2017 with elevated urinary calcium levels.  He was offered indapamide but is not currently taking this medication.  Stone composition previously 85% calcium oxalate, 15% calcium phosphate.  PMH: Past Medical History:  Diagnosis Date  . Kidney stones   . Microscopic hematuria     Surgical History: Past Surgical History:  Procedure Laterality Date  . CYSTOSCOPY WITH STENT PLACEMENT Right 06/14/2018   Procedure: CYSTOSCOPY WITH STENT PLACEMENT;  Surgeon: Vanna ScotlandBrandon, Lititia Sen, MD;  Location: [facility] ORS;  Service: Urology;  Laterality: Right;  . EXTRACORPOREAL SHOCK WAVE LITHOTRIPSY Left 09/20/2015   Procedure: EXTRACORPOREAL SHOCK WAVE LITHOTRIPSY (ESWL);  Surgeon: Hildred LaserBrian James Budzyn, MD;  Location: [facility] ORS;  Service: Urology;  Laterality: Left;  . NO PAST SURGERIES    . none    .  URETEROSCOPY WITH HOLMIUM LASER LITHOTRIPSY Right 06/14/2018   Procedure: URETEROSCOPY WITH HOLMIUM LASER LITHOTRIPSY;  Surgeon: Vanna ScotlandBrandon, Farren Nelles, MD;  Location: [facility] ORS;  Service: Urology;  Laterality: Right;    Home Medications:  Allergies as of 08/03/2018   No Known Allergies     Medication List       Accurate as of August 03, 2018  1:12 PM. Always use your most recent med list.        amLODipine 5 MG tablet Commonly known as:  NORVASC Take 1 tablet (5 mg total) by mouth daily.   indapamide 2.5 MG tablet Commonly known as:  LOZOL Take 1 tablet (2.5 mg total) by mouth daily.       Allergies: No Known Allergies  Family History: Family History  Problem Relation Age of Onset  . Kidney disease Maternal Aunt        dialysis  . Lung cancer Father   . Kidney disease Mother        one kidney  . Kidney Stones Unknown   . Prostate cancer Neg Hx     Social History:  reports that he has been smoking cigars. He has never used smokeless tobacco. He reports current alcohol use. He reports current drug use. Drug: Marijuana.  ROS: UROLOGY Frequent Urination?: Yes Hard to postpone urination?: No Burning/pain with urination?: No Get up at night to urinate?: Yes Leakage of urine?: No Urine stream starts and stops?: No Trouble starting stream?: Yes Do you have to strain to urinate?: No Blood in urine?: No Urinary tract infection?: No Sexually transmitted disease?:  No Injury to kidneys or bladder?: No Painful intercourse?: No Weak stream?: No Erection problems?: No Penile pain?: No  Gastrointestinal Nausea?: No Vomiting?: No Indigestion/heartburn?: No Diarrhea?: No Constipation?: No  Constitutional Fever: No Night sweats?: No Weight loss?: No Fatigue?: No  Skin Skin rash/lesions?: No Itching?: No  Eyes Blurred vision?: No Double vision?: No  Ears/Nose/Throat Sore throat?: No Sinus problems?: No  Hematologic/Lymphatic Swollen glands?: No Easy  bruising?: No  Cardiovascular Leg swelling?: No Chest pain?: No  Respiratory Cough?: No Shortness of breath?: No  Endocrine Excessive thirst?: No  Musculoskeletal Back pain?: Yes Joint pain?: No  Neurological Headaches?: No Dizziness?: No  Psychologic Depression?: No Anxiety?: No  Physical Exam: BP (!) 147/100 (BP Location: Left Arm, Patient Position: Sitting)   Pulse 69   Ht 5\' 9"  (1.753 m)   Wt 205 lb (93 kg)   BMI 30.27 kg/m   Constitutional:  Alert and oriented, No acute distress. HEENT: [place] AT, moist mucus membranes.  Trachea midline, no masses. Cardiovascular: No clubbing, cyanosis, or edema. Respiratory: Normal respiratory effort, no increased work of breathing. Skin: No rashes, bruises or suspicious lesions. Neurologic: Grossly intact, no focal deficits, moving all 4 extremities. Psychiatric: Normal mood and affect.  Pertinent Imaging: CLINICAL DATA:  Right ureteral stone, status post ureteroscopy.  EXAM: RENAL / URINARY TRACT ULTRASOUND COMPLETE  COMPARISON:  CT scan 06/13/2018  FINDINGS: Right Kidney:  Renal measurements: 11.5 by 7.9 by 6.7 cm = volume: 319.1 mL. Small right renal calculi. Echogenicity similar to that of the liver. No residual hydronephrosis or hydroureter. No mass.  Left Kidney:  Renal measurements: 10.8 by 5.9 by 4.9 cm = volume: 163 mL. Echogenicity within normal limits. Cystic lesion in the mid kidney measures 1.3 by 1.4 by 1.1 cm. No hydronephrosis or solid mass observed. Suspected nephrolithiasis. Echogenicity within normal limits.  Bladder:  Appears normal for degree of bladder distention. Bilateral ureteral jets are apparent.  IMPRESSION: 1. Renal asymmetry with the right kidney notably larger than the left. 2. Previous right hydronephrosis is no longer present. Bilateral ureteral jets are present. 3. Bilateral nephrolithiasis. 4. Left mid kidney cyst.   Electronically Signed   By: Gaylyn Rong M.D.   On: 07/22/2018 13:52  RUS ultrasound imaging was personally reviewed today with the patient.  Assessment & Plan:    1. Recurrent Stone Disease Previously had 24 hour urine, reviewed today Previously took indapamide but stopped this medication due to dizziness, interested in trying this medication again today Precautions to hold this medication if he develops any dizziness or other side effects We discussed general stone prevention techniques including drinking plenty water with goal of producing 2.5 L urine daily, increased citric acid intake, avoidance of high oxalate containing foods, and decreased salt intake.  Information about dietary recommendations given today.   2. Bilateral Nephrolithiasis Residual small bilateral stones present based on recent ultrasound We will continue to follow, no surgical intervention for the small stones at this time  Return in about 6 months (around 02/01/2019) for Xray prior.  Vanna Scotland, MD  [facility] [address] [place], [place] [address] [phone]   I, Robbi Garter , am acting as a scribe for Vanna Scotland, MD  I have reviewed the above documentation for accuracy and completeness, and I agree with the above.   Vanna Scotland, MD

## 2018-11-25 ENCOUNTER — Other Ambulatory Visit: Payer: Self-pay | Admitting: Urology

## 2019-02-08 ENCOUNTER — Ambulatory Visit: Payer: Self-pay | Admitting: Urology

## 2019-02-08 ENCOUNTER — Encounter: Payer: Self-pay | Admitting: Urology

## 2020-07-27 ENCOUNTER — Other Ambulatory Visit: Payer: Self-pay

## 2020-07-27 ENCOUNTER — Ambulatory Visit
Admission: EM | Admit: 2020-07-27 | Discharge: 2020-07-27 | Disposition: A | Discharge status: Home / Self Care | Payer: POS | Attending: Emergency Medicine | Admitting: Emergency Medicine

## 2020-07-27 DIAGNOSIS — L500 Allergic urticaria: Secondary | ICD-10-CM | POA: Diagnosis not present

## 2020-07-27 MED ORDER — HYDROXYZINE HCL 25 MG PO TABS: 25.0000 mg | ORAL_TABLET | Freq: Four times a day (QID) | ORAL | 0 refills | Status: DC

## 2020-07-27 MED ORDER — PREDNISONE 10 MG (21) PO TBPK: ORAL_TABLET | Freq: Every day | ORAL | 0 refills | Status: DC

## 2020-07-27 NOTE — ED Provider Notes (Signed)
[facility]    CSN: [id number] Arrival date & time: 07/27/20  1510      History   Chief Complaint Chief Complaint  Patient presents with  . Rash    HPI Cameron Freeman is a 39 y.o. male  History as below presenting for pruritic, diffuse rash that appeared to be hives x2 days.  Cannot think of inciting event or trigger.  No swelling of lips, tongue, throat, difficulty breathing or chest pain.  Past Medical History:  Diagnosis Date  . Kidney stones   . Microscopic hematuria     Patient Active Problem List   Diagnosis Date Noted  . Hydronephrosis 06/13/2018  . Chronic renal impairment, stage 3 (moderate) (HCC) 04/14/2016  . Left ureteral stone 09/13/2015  . Hydronephrosis with urinary obstruction due to ureteral calculus 09/13/2015  . History of renal stone 09/01/2015  . Microscopic hematuria 09/01/2015    Past Surgical History:  Procedure Laterality Date  . CYSTOSCOPY WITH STENT PLACEMENT Right 06/14/2018   Procedure: CYSTOSCOPY WITH STENT PLACEMENT;  Surgeon: Vanna Scotland, MD;  Location: [facility];  Service: Urology;  Laterality: Right;  . EXTRACORPOREAL SHOCK WAVE LITHOTRIPSY Left 09/20/2015   Procedure: EXTRACORPOREAL SHOCK WAVE LITHOTRIPSY (ESWL);  Surgeon: Hildred Laser, MD;  Location: [facility];  Service: Urology;  Laterality: Left;  . NO PAST SURGERIES    . none    . URETEROSCOPY WITH HOLMIUM LASER LITHOTRIPSY Right 06/14/2018   Procedure: URETEROSCOPY WITH HOLMIUM LASER LITHOTRIPSY;  Surgeon: Vanna Scotland, MD;  Location: [facility];  Service: Urology;  Laterality: Right;       Home Medications    Prior to Admission medications   Medication Sig Start Date End Date Taking? Authorizing Provider  amLODipine (NORVASC) 5 MG tablet Take 1 tablet (5 mg total) by mouth daily. 06/15/18  Yes Enid Baas, MD  hydrOXYzine (ATARAX/VISTARIL) 25 MG tablet Take 1 tablet (25 mg total) by mouth every 6 (six) hours. 07/27/20  Yes Hall-Potvin,  [place], PA-C  predniSONE (STERAPRED UNI-PAK 21 TAB) 10 MG (21) TBPK tablet Take by mouth daily. Take steroid taper as written 07/27/20  Yes Hall-Potvin, [place], PA-C  indapamide (LOZOL) 2.5 MG tablet TAKE 1 TABLET BY MOUTH EVERY DAY 11/25/18   McGowan, [place], PA-C    Family History Family History  Problem Relation Age of Onset  . Kidney disease Maternal Aunt        dialysis  . Lung cancer Father   . Kidney disease Mother        one kidney  . Kidney Stones Other   . Prostate cancer Neg Hx     Social History Social History   Tobacco Use  . Smoking status: Current Every Day Smoker    Types: Cigars  . Smokeless tobacco: Never Used  Substance Use Topics  . Alcohol use: Yes    Alcohol/week: 0.0 standard drinks    Comment: liquor and beer  . Drug use: Yes    Types: Marijuana     Allergies   Patient has no known allergies.   Review of Systems Review of Systems  Constitutional: Negative for fatigue and fever.  Respiratory: Negative for cough and shortness of breath.   Cardiovascular: Negative for chest pain and palpitations.  Gastrointestinal: Negative for abdominal pain, diarrhea and vomiting.  Musculoskeletal: Negative for arthralgias and myalgias.  Skin: Positive for rash. Negative for wound.  Neurological: Negative for speech difficulty and headaches.  All other systems reviewed and are negative.    Physical  Exam Triage Vital Signs ED Triage Vitals  Enc Vitals Group     BP 07/27/20 1736 (!) 156/120     Pulse Rate 07/27/20 1736 69     Resp 07/27/20 1736 18     Temp 07/27/20 1736 98.9 F (37.2 C)     Temp Source 07/27/20 1736 Oral     SpO2 07/27/20 1736 98 %     Weight 07/27/20 1733 205 lb (93 kg)     Height 07/27/20 1733 5\' 9"  (1.753 m)     Head Circumference --      Peak Flow --      Pain Score 07/27/20 1733 0     Pain Loc --      Pain Edu? --      Excl. in GC? --    No data found.  Updated Vital Signs BP (!) 156/120 (BP Location: Left Arm)    Pulse 69   Temp 98.9 F (37.2 C) (Oral)   Resp 18   Ht 5\' 9"  (1.753 m)   Wt 205 lb (93 kg)   SpO2 98%   BMI 30.27 kg/m   Visual Acuity Right Eye Distance:   Left Eye Distance:   Bilateral Distance:    Right Eye Near:   Left Eye Near:    Bilateral Near:     Physical Exam Constitutional:      General: He is not in acute distress. HENT:     Head: Normocephalic and atraumatic.  Eyes:     General: No scleral icterus.    Pupils: Pupils are equal, round, and reactive to light.  Cardiovascular:     Rate and Rhythm: Normal rate.  Pulmonary:     Effort: Pulmonary effort is normal. No respiratory distress.     Breath sounds: No wheezing.  Skin:    Coloration: Skin is not jaundiced or pale.     Findings: Rash present.     Comments: Mild urticaria over chest, abdomen, arms, back.  Neurological:     Mental Status: He is alert and oriented to person, place, and time.      UC Treatments / Results  Labs (all labs ordered are listed, but only abnormal results are displayed) Labs Reviewed - No data to display  EKG   Radiology No results found.  Procedures Procedures (including critical care time)  Medications Ordered in UC Medications - No data to display  Initial Impression / Assessment and Plan / UC Course  I have reviewed the triage vital signs and the nursing notes.  Pertinent labs & imaging results that were available during my care of the patient were reviewed by me and considered in my medical decision making (see chart for details).     Concerning for allergic urticaria without known trigger.  Will treat supportively as below, follow-up with PCP.  Return precautions discussed, pt verbalized understanding and is agreeable to plan. Final Clinical Impressions(s) / UC Diagnoses   Final diagnoses:  Allergic urticaria     Discharge Instructions     Keep skin clean and dry. May apply triamcinolone twice daily x1 week. Use Hydroxyzine before  bedtime. Avoid hot water as this can further dry out and irritate skin. Important to wash all clothes, bedding, blankets in hot water. Return for worsening rash, pain, swelling, redness, fever.    ED Prescriptions    Medication Sig Dispense Auth. Provider   predniSONE (STERAPRED UNI-PAK 21 TAB) 10 MG (21) TBPK tablet Take by mouth daily. Take steroid taper as  written 21 tablet Hall-Potvin, [place], PA-C   hydrOXYzine (ATARAX/VISTARIL) 25 MG tablet Take 1 tablet (25 mg total) by mouth every 6 (six) hours. 12 tablet Hall-Potvin, [place], PA-C     PDMP not reviewed this encounter.   Odette Fraction [place], [place] 07/27/20 1743

## 2020-07-27 NOTE — Discharge Instructions (Signed)
Keep skin clean and dry. May apply triamcinolone twice daily x1 week. Use Hydroxyzine before bedtime. Avoid hot water as this can further dry out and irritate skin. Important to wash all clothes, bedding, blankets in hot water. Return for worsening rash, pain, swelling, redness, fever. 

## 2020-07-27 NOTE — ED Triage Notes (Signed)
Pt presents with c/o rash/hives that started 2 days ago. Pt does have current rash from his face/head down his arms and trunk. Pt does not think there are any on his legs. Pt did take Benadryl and his symptoms improved some. Pt denies any known new foods/detergents/soaps, etc. Pt has no known allergies. Pt has not had any previous reactions of this kind.

## 2020-07-27 NOTE — ED Notes (Signed)
Pt states is around the corner away. States on his way

## 2021-02-10 ENCOUNTER — Encounter: Payer: Self-pay | Admitting: Emergency Medicine

## 2021-02-10 ENCOUNTER — Other Ambulatory Visit: Payer: Self-pay

## 2021-02-10 ENCOUNTER — Ambulatory Visit
Admission: EM | Admit: 2021-02-10 | Discharge: 2021-02-10 | Disposition: A | Discharge status: Home / Self Care | Payer: POS | Attending: Emergency Medicine | Admitting: Emergency Medicine

## 2021-02-10 DIAGNOSIS — U071 COVID-19: Secondary | ICD-10-CM | POA: Diagnosis not present

## 2021-02-10 MED ORDER — BENZONATATE 200 MG PO CAPS: 200.0000 mg | ORAL_CAPSULE | Freq: Three times a day (TID) | ORAL | 0 refills | Status: DC | PRN

## 2021-02-10 MED ORDER — ACETAMINOPHEN 325 MG PO TABS
650.0000 mg | ORAL_TABLET | Freq: Once | ORAL | Status: AC
  Administered 2021-02-10: 650 mg via ORAL

## 2021-02-10 NOTE — ED Provider Notes (Signed)
HPI  SUBJECTIVE:  Cameron Freeman is a 40 y.o. male who presents with fevers 102, body aches, headaches, sore throat, loss of sense of smell or taste, cough, starting yesterday.  No nasal congestion, rhinorrhea.  He states he has had a positive home COVID test. he states that he had some abdominal pain, but it resolved.  No nasal congestion, rhinorrhea, shortness of breath, nausea, vomiting, diarrhea.  He was exposed to COVID 2 to 3 days ago.  He did not get the vaccine.  He has tried 800 mg of ibuprofen, Tylenol without improvement in his symptoms.  No aggravating factors.  He has a past medical history of hypertension.,  Chronic kidney disease Per chart review, although patient denies this.  No history of diabetes, pulmonary disease.  PMD: In [place]   Past Medical History:  Diagnosis Date   Kidney stones    Microscopic hematuria     Past Surgical History:  Procedure Laterality Date   CYSTOSCOPY WITH STENT PLACEMENT Right 06/14/2018   Procedure: CYSTOSCOPY WITH STENT PLACEMENT;  Surgeon: Hollice Espy, MD;  Location: [facility];  Service: Urology;  Laterality: Right;   EXTRACORPOREAL SHOCK WAVE LITHOTRIPSY Left 09/20/2015   Procedure: EXTRACORPOREAL SHOCK WAVE LITHOTRIPSY (ESWL);  Surgeon: Nickie Retort, MD;  Location: [facility];  Service: Urology;  Laterality: Left;   NO PAST SURGERIES     none     URETEROSCOPY WITH HOLMIUM LASER LITHOTRIPSY Right 06/14/2018   Procedure: URETEROSCOPY WITH HOLMIUM LASER LITHOTRIPSY;  Surgeon: Hollice Espy, MD;  Location: [facility];  Service: Urology;  Laterality: Right;    Family History  Problem Relation Age of Onset   Kidney disease Maternal Aunt        dialysis   Lung cancer Father    Kidney disease Mother        one kidney   Kidney Stones Other    Prostate cancer Neg Hx     Social History   Tobacco Use   Smoking status: Every Day    Types: Cigars   Smokeless tobacco: Never  Substance Use Topics   Alcohol use: Yes     Alcohol/week: 0.0 standard drinks    Comment: liquor and beer   Drug use: Yes    Types: Marijuana    No current facility-administered medications for this encounter.  Current Outpatient Medications:    amLODipine (NORVASC) 5 MG tablet, Take 1 tablet (5 mg total) by mouth daily., Disp: 30 tablet, Rfl: 2   benzonatate (TESSALON) 200 MG capsule, Take 1 capsule (200 mg total) by mouth 3 (three) times daily as needed for cough., Disp: 30 capsule, Rfl: 0   hydrOXYzine (ATARAX/VISTARIL) 25 MG tablet, Take 1 tablet (25 mg total) by mouth every 6 (six) hours., Disp: 12 tablet, Rfl: 0   indapamide (LOZOL) 2.5 MG tablet, TAKE 1 TABLET BY MOUTH EVERY DAY, Disp: 90 tablet, Rfl: 3  No Known Allergies   ROS  As noted in HPI.   Physical Exam  BP (!) 167/105   Pulse 82   Temp (!) 101.2 F (38.4 C) (Oral)   Resp 16   SpO2 98%   Constitutional: Well developed, well nourished, no acute distress Eyes:  EOMI, conjunctiva normal bilaterally HENT: Normocephalic, atraumatic,mucus membranes moist Respiratory: Normal inspiratory effort, lungs clear bilaterally Cardiovascular: Normal rate, regular rhythm no murmurs rubs or gallops GI: nondistended skin: No rash, skin intact Musculoskeletal: no deformities Neurologic: Alert & oriented x 3, no focal neuro deficits Psychiatric: Speech and behavior appropriate  ED Course   Medications  acetaminophen (TYLENOL) tablet 650 mg (650 mg Oral Given 02/10/21 0824)    No orders of the defined types were placed in this encounter.   No results found for this or any previous visit (from the past 24 hour(s)). No results found. Results for orders placed or performed during the hospital encounter of 23/95/32  Basic metabolic panel  Result Value Ref Range   Glucose 108 (H) 65 - 99 mg/dL   BUN 10 6 - 20 mg/dL   Creatinine, Ser 1.29 (H) 0.76 - 1.27 mg/dL   eGFR 73 >59 mL/min/1.73   BUN/Creatinine Ratio 8 (L) 9 - 20   Sodium 141 134 - 144 mmol/L    Potassium 4.4 3.5 - 5.2 mmol/L   Chloride 104 96 - 106 mmol/L   CO2 22 20 - 29 mmol/L   Calcium 9.1 8.7 - 10.2 mg/dL     ED Clinical Impression  1. COVID-19 virus infection      ED Assessment/Plan  patient with COVID.  Will qualify for antiviral treatment based on chronic kidney disease and hypertension.  We discussed Molnupiravir versus Paxlovid.  Patient prefers Paxlovid.  Unable to find renal function in the past 6 months.  Will check BMP and start on Paxlovid.  Patient phone number [phone]  Last creatinine in 2019 1.93.  May end up on Molnupiravir  GFR 73.  He is eligible for regularly dosed Paxlovid.  He will need to monitor his blood pressure while taking the Paxlovid but we will have him continue the amlodipine.  There are no other drug drug interactions.  7/19. 0820-attempted to call patient 2x to let him know about prescription waiting at pharmacy.  No answer.  Voicemail box full.  Home with Tylenol, hold off on the ibuprofen until we have creatinine results, Tessalon for the cough, work note to have him return to work on Friday.  Discussed labs,MDM, treatment plan, and plan for follow-up with patient. patient agrees with plan.   Meds ordered this encounter  Medications   acetaminophen (TYLENOL) tablet 650 mg   benzonatate (TESSALON) 200 MG capsule    Sig: Take 1 capsule (200 mg total) by mouth 3 (three) times daily as needed for cough.    Dispense:  30 capsule    Refill:  0      *This clinic note was created using [employer]. Therefore, there may be occasional mistakes despite careful proofreading.  ?    Melynda Ripple, MD 02/12/21 [phone]

## 2021-02-10 NOTE — ED Triage Notes (Addendum)
PT has had positive home COVID test. Sore throat, headache, fever, chills.   Symptoms started yesterday

## 2021-02-10 NOTE — Discharge Instructions (Addendum)
Your last creatinine was a little elevated, so hold off on the ibuprofen until we get your lab work back.  It should be back in a day or 2.  I will then prescribe Paxil even on Molnupiravir based on those labs.  1000 mg of Tylenol 3-4 times a day as needed for pain, fever, body aches.  Tessalon for the cough.  Get out and walk a little bit every day.  It does not have to be far does not have to be fast, but will keep your lungs open and help prevent blood clots.

## 2021-02-11 LAB — BASIC METABOLIC PANEL
BUN/Creatinine Ratio: 8 — ABNORMAL LOW (ref 9–20)
BUN: 10 mg/dL (ref 6–20)
CO2: 22 mmol/L (ref 20–29)
Calcium: 9.1 mg/dL (ref 8.7–10.2)
Chloride: 104 mmol/L (ref 96–106)
Creatinine, Ser: 1.29 mg/dL — ABNORMAL HIGH (ref 0.76–1.27)
Glucose: 108 mg/dL — ABNORMAL HIGH (ref 65–99)
Potassium: 4.4 mmol/L (ref 3.5–5.2)
Sodium: 141 mmol/L (ref 134–144)
eGFR: 73 mL/min/{1.73_m2} (ref >59)

## 2021-02-12 ENCOUNTER — Telehealth: Payer: Self-pay | Admitting: Emergency Medicine

## 2021-02-12 DIAGNOSIS — U071 COVID-19: Secondary | ICD-10-CM

## 2021-02-12 MED ORDER — NIRMATRELVIR/RITONAVIR (PAXLOVID)TABLET: 3.0000 | ORAL_TABLET | Freq: Two times a day (BID) | ORAL | 0 refills | Status: AC

## 2021-02-12 NOTE — Telephone Encounter (Signed)
Patient COVID-positive.  GFR 74.  will prescribe Paxlovid pharmacy on record.  Attempted to call patient at 0820, no answer, voice mailbox full.  Will attempt again.

## 2021-02-19 ENCOUNTER — Other Ambulatory Visit: Payer: Self-pay | Admitting: Family Medicine

## 2021-02-19 ENCOUNTER — Ambulatory Visit
Admission: RE | Admit: 2021-02-19 | Discharge: 2021-02-19 | Disposition: A | Discharge status: Home / Self Care | Payer: POS | Attending: Family Medicine | Admitting: Family Medicine

## 2021-02-19 ENCOUNTER — Ambulatory Visit
Admission: RE | Admit: 2021-02-19 | Discharge: 2021-02-19 | Disposition: A | Discharge status: Home / Self Care | Payer: POS | Source: Ambulatory Visit | Attending: Family Medicine | Admitting: Family Medicine

## 2021-02-19 DIAGNOSIS — U071 COVID-19: Secondary | ICD-10-CM | POA: Diagnosis not present

## 2021-10-17 ENCOUNTER — Encounter: Payer: Self-pay | Admitting: Emergency Medicine

## 2021-10-17 ENCOUNTER — Ambulatory Visit (INDEPENDENT_AMBULATORY_CARE_PROVIDER_SITE_OTHER): Payer: Self-pay

## 2021-10-17 ENCOUNTER — Other Ambulatory Visit: Payer: Self-pay

## 2021-10-17 ENCOUNTER — Ambulatory Visit
Admission: EM | Admit: 2021-10-17 | Discharge: 2021-10-17 | Disposition: A | Discharge status: Home / Self Care | Payer: Worker's Compensation | Attending: Family Medicine | Admitting: Family Medicine

## 2021-10-17 DIAGNOSIS — S6992XA Unspecified injury of left wrist, hand and finger(s), initial encounter: Secondary | ICD-10-CM

## 2021-10-17 DIAGNOSIS — M25532 Pain in left wrist: Secondary | ICD-10-CM

## 2021-10-17 DIAGNOSIS — Z026 Encounter for examination for insurance purposes: Secondary | ICD-10-CM | POA: Diagnosis not present

## 2021-10-17 MED ORDER — NAPROXEN 375 MG PO TABS: 375.0000 mg | ORAL_TABLET | Freq: Two times a day (BID) | ORAL | 0 refills | Status: DC

## 2021-10-17 NOTE — ED Triage Notes (Signed)
W/C for [employer]. Pt was picking up a package at work and when he twist his left wrist he felt pain. ?

## 2021-10-17 NOTE — ED Provider Notes (Signed)
?[facility] ? ? ? ?CSN: [id number] ?Arrival date & time: 10/17/21  1829 ? ? ?  ? ?History   ?Chief Complaint ?No chief complaint on file. ? ? ?HPI ?Cameron Freeman is a 41 y.o. male.  ? ?HPI ?Patient presents today for evaluation of a Worker's Compensation related injury involving his left wrist.  Patient is employed at [employer]. Patient reports that he lifted approximately a 19 to 20 pound box and felt a snapping sensation and immediate pain rating up and down medial laterally at his left wrist.  Endorses some numbness and tingling of the thumb and distal digits of the left hand.  Patient denies any prior wrist injury.  He has not taken anything since injury occurred. It is after hours or patient was advised to follow-up.  Urgent care for evaluation of his injury.  Injury did occur approximately 2 hours ago today.  He maintains full range of motion although endorses pain with flexion and hyperextension of his wrist.  He has no bruising or swelling.  ?Past Medical History:  ?Diagnosis Date  ? Kidney stones   ? Microscopic hematuria   ? ? ?Patient Active Problem List  ? Diagnosis Date Noted  ? Hydronephrosis 06/13/2018  ? Chronic renal impairment, stage 3 (moderate) (HCC) 04/14/2016  ? Left ureteral stone 09/13/2015  ? Hydronephrosis with urinary obstruction due to ureteral calculus 09/13/2015  ? History of renal stone 09/01/2015  ? Microscopic hematuria 09/01/2015  ? ? ?Past Surgical History:  ?Procedure Laterality Date  ? CYSTOSCOPY WITH STENT PLACEMENT Right 06/14/2018  ? Procedure: CYSTOSCOPY WITH STENT PLACEMENT;  Surgeon: Vanna Scotland, MD;  Location: [facility];  Service: Urology;  Laterality: Right;  ? EXTRACORPOREAL SHOCK WAVE LITHOTRIPSY Left 09/20/2015  ? Procedure: EXTRACORPOREAL SHOCK WAVE LITHOTRIPSY (ESWL);  Surgeon: Hildred Laser, MD;  Location: [facility];  Service: Urology;  Laterality: Left;  ? NO PAST SURGERIES    ? none    ? URETEROSCOPY WITH HOLMIUM LASER LITHOTRIPSY Right 06/14/2018  ?  Procedure: URETEROSCOPY WITH HOLMIUM LASER LITHOTRIPSY;  Surgeon: Vanna Scotland, MD;  Location: [facility];  Service: Urology;  Laterality: Right;  ? ? ? ? ? ?Home Medications   ? ?Prior to Admission medications   ?Medication Sig Start Date End Date Taking? Authorizing Provider  ?amLODipine (NORVASC) 5 MG tablet Take 1 tablet (5 mg total) by mouth daily. 06/15/18   Enid Baas, MD  ?benzonatate (TESSALON) 200 MG capsule Take 1 capsule (200 mg total) by mouth 3 (three) times daily as needed for cough. 02/10/21   Domenick Gong, MD  ?hydrOXYzine (ATARAX/VISTARIL) 25 MG tablet Take 1 tablet (25 mg total) by mouth every 6 (six) hours. 07/27/20   Hall-Potvin, [place], PA-C  ?indapamide (LOZOL) 2.5 MG tablet TAKE 1 TABLET BY MOUTH EVERY DAY 11/25/18   Michiel Cowboy A, PA-C  ? ? ?Family History ?Family History  ?Problem Relation Age of Onset  ? Kidney disease Maternal Aunt   ?     dialysis  ? Lung cancer Father   ? Kidney disease Mother   ?     one kidney  ? Kidney Stones Other   ? Prostate cancer Neg Hx   ? ? ?Social History ?Social History  ? ?Tobacco Use  ? Smoking status: Every Day  ?  Types: Cigars  ? Smokeless tobacco: Never  ?Substance Use Topics  ? Alcohol use: Yes  ?  Alcohol/week: 0.0 standard drinks  ?  Comment: liquor and beer  ? Drug use:  Yes  ?  Types: Marijuana  ? ? ? ?Allergies   ?Patient has no known allergies. ? ? ?Review of Systems ?Review of Systems ?Pertinent negatives listed in HPI  ?Physical Exam ?Triage Vital Signs ?ED Triage Vitals  ?Enc Vitals Group  ?   BP   ?   Pulse   ?   Resp   ?   Temp   ?   Temp src   ?   SpO2   ?   Weight   ?   Height   ?   Head Circumference   ?   Peak Flow   ?   Pain Score   ?   Pain Loc   ?   Pain Edu?   ?   Excl. in GC?   ? ?No data found. ? ?Updated Vital Signs ?BP (!) 164/118 (BP Location: Right Arm)   Pulse 66   Temp 98.7 ?F (37.1 ?C) (Oral)   Resp 18   SpO2 97%  ? ?Visual Acuity ?Right Eye Distance:   ?Left Eye Distance:   ?Bilateral Distance:    ? ?Right Eye Near:   ?Left Eye Near:    ?Bilateral Near:    ? ?Physical Exam ?Constitutional:   ?   Appearance: Normal appearance.  ?HENT:  ?   Head: Normocephalic and atraumatic.  ?Cardiovascular:  ?   Rate and Rhythm: Normal rate and regular rhythm.  ?Pulmonary:  ?   Effort: Pulmonary effort is normal.  ?   Breath sounds: Normal breath sounds.  ?Musculoskeletal:  ?   Right wrist: Normal.  ?   Left wrist: Tenderness present. Decreased range of motion.  ?   Comments: Pain with flexion of the left wrist and hyperextension.  No swelling or ecchymosis present.  No defect visible deformity involving the left wrist or the left hand.  ?Neurological:  ?   Mental Status: He is alert.  ?Psychiatric:     ?   Attention and Perception: Attention normal.     ?   Mood and Affect: Mood normal.     ?   Speech: Speech normal.  ? ? ? ?UC Treatments / Results  ?Labs ?(all labs ordered are listed, but only abnormal results are displayed) ?Labs Reviewed - No data to display ? ?EKG ? ? ?Radiology ?DG Wrist Complete Left ? ?Result Date: 10/17/2021 ?CLINICAL DATA:  Left wrist injury, pain EXAM: LEFT WRIST - COMPLETE 3+ VIEW COMPARISON:  None. FINDINGS: There is no evidence of fracture or dislocation. There is no evidence of arthropathy or other focal bone abnormality. Soft tissues are unremarkable. IMPRESSION: Negative. Electronically Signed   By: Helyn Numbers M.D.   On: 10/17/2021 19:06   ? ?Procedures ?Procedures (including critical care time) ? ?Medications Ordered in UC ?Medications - No data to display ? ?Initial Impression / Assessment and Plan / UC Course  ?I have reviewed the triage vital signs and the nursing notes. ? ?Pertinent labs & imaging results that were available during my care of the patient were reviewed by me and considered in my medical decision making (see chart for details). ? ?  ?Injury involving the left wrist, suspect wrist strain given that imaging is negative and patient has no appreciable swelling or edema.   History of ecchymosis.  Therefore suspect conservative treatment with RICE will improve and resolve symptoms.  This is a workers comp related injury therefore patient will be required to follow-up at occupational health for completion of return to work  paperwork and return to work evaluation.  Patient given a work note for tomorrow to allow time to follow-up with occupational medicine. ?Final Clinical Impressions(s) / UC Diagnoses  ? ?Final diagnoses:  ?Injury of left wrist, initial encounter  ?Encounter related to worker's compensation claim  ? ?Discharge Instructions   ?None ?  ? ?ED Prescriptions   ?None ?  ? ?PDMP not reviewed this encounter. ?  ?Bing NeighborsHarris, Laakea Pereira S, FNP ?10/26/21 1020 ? ?

## 2022-01-02 ENCOUNTER — Ambulatory Visit
Admission: EM | Admit: 2022-01-02 | Discharge: 2022-01-02 | Disposition: A | Discharge status: Home / Self Care | Payer: 59 | Attending: Emergency Medicine | Admitting: Emergency Medicine

## 2022-01-02 DIAGNOSIS — I10 Essential (primary) hypertension: Secondary | ICD-10-CM

## 2022-01-02 DIAGNOSIS — B349 Viral infection, unspecified: Secondary | ICD-10-CM

## 2022-01-02 HISTORY — DX: Essential (primary) hypertension: I10

## 2022-01-02 MED ORDER — BENZONATATE 100 MG PO CAPS: 100.0000 mg | ORAL_CAPSULE | Freq: Three times a day (TID) | ORAL | 0 refills | Status: DC | PRN

## 2022-01-02 NOTE — ED Triage Notes (Signed)
Patient presents to Urgent Care with complaints of cough, sore thrroat, HA, and runny nose x 1 day. Treating sore throat with chloraseptic spray.

## 2022-01-02 NOTE — ED Provider Notes (Signed)
[facility]    CSN: [id number] Arrival date & time: 01/02/22  4259      History   Chief Complaint Chief Complaint  Patient presents with   Sore Throat   Nasal Congestion   Cough   Headache    1 day    HPI Cameron Freeman is a 41 y.o. male.  Patient present with headache, chills, sore throat, postnasal drop, runny nose, cough x 1 day.  No fever, rash, shortness of breath, vomiting, diarrhea, or other symptoms.  Treatment at home with Chloraseptic throat spray.  His medical history includes hypertension, kidney stones, CKD, hydronephrosis.  The history is provided by the patient and medical records.    Past Medical History:  Diagnosis Date   Hypertension    Kidney stones    Microscopic hematuria     Patient Active Problem List   Diagnosis Date Noted   Hydronephrosis 06/13/2018   Chronic renal impairment, stage 3 (moderate) (HCC) 04/14/2016   Left ureteral stone 09/13/2015   Hydronephrosis with urinary obstruction due to ureteral calculus 09/13/2015   History of renal stone 09/01/2015   Microscopic hematuria 09/01/2015    Past Surgical History:  Procedure Laterality Date   CYSTOSCOPY WITH STENT PLACEMENT Right 06/14/2018   Procedure: CYSTOSCOPY WITH STENT PLACEMENT;  Surgeon: Vanna Scotland, MD;  Location: [facility];  Service: Urology;  Laterality: Right;   EXTRACORPOREAL SHOCK WAVE LITHOTRIPSY Left 09/20/2015   Procedure: EXTRACORPOREAL SHOCK WAVE LITHOTRIPSY (ESWL);  Surgeon: Hildred Laser, MD;  Location: [facility];  Service: Urology;  Laterality: Left;   NO PAST SURGERIES     none     URETEROSCOPY WITH HOLMIUM LASER LITHOTRIPSY Right 06/14/2018   Procedure: URETEROSCOPY WITH HOLMIUM LASER LITHOTRIPSY;  Surgeon: Vanna Scotland, MD;  Location: [facility];  Service: Urology;  Laterality: Right;       Home Medications    Prior to Admission medications   Medication Sig Start Date End Date Taking? Authorizing Provider  benzonatate  (TESSALON) 100 MG capsule Take 1 capsule (100 mg total) by mouth 3 (three) times daily as needed for cough. 01/02/22  Yes Mickie Bail, NP  amLODipine (NORVASC) 5 MG tablet Take 1 tablet (5 mg total) by mouth daily. 06/15/18   Enid Baas, MD  hydrOXYzine (ATARAX/VISTARIL) 25 MG tablet Take 1 tablet (25 mg total) by mouth every 6 (six) hours. 07/27/20   Hall-Potvin, [place], PA-C  indapamide (LOZOL) 2.5 MG tablet TAKE 1 TABLET BY MOUTH EVERY DAY 11/25/18   McGowan, Carollee Herter A, PA-C  meloxicam (MOBIC) 15 MG tablet Take 15 mg by mouth 3 (three) times daily. 12/10/21   [provider]  naproxen (NAPROSYN) 375 MG tablet Take 1 tablet (375 mg total) by mouth 2 (two) times daily. 10/17/21   Bing Neighbors, FNP    Family History Family History  Problem Relation Age of Onset   Kidney disease Maternal Aunt        dialysis   Lung cancer Father    Kidney disease Mother        one kidney   Kidney Stones Other    Prostate cancer Neg Hx     Social History Social History   Tobacco Use   Smoking status: Every Day    Types: Cigars   Smokeless tobacco: Never  Vaping Use   Vaping Use: Never used  Substance Use Topics   Alcohol use: Yes    Alcohol/week: 0.0 standard drinks of alcohol    Comment:  liquor and beer   Drug use: Yes    Types: Marijuana     Allergies   Patient has no known allergies.   Review of Systems Review of Systems  Constitutional:  Positive for chills. Negative for fever.  HENT:  Positive for postnasal drip, rhinorrhea and sore throat. Negative for ear pain.   Respiratory:  Positive for cough. Negative for shortness of breath.   Cardiovascular:  Negative for chest pain and palpitations.  Gastrointestinal:  Negative for diarrhea and vomiting.  Skin:  Negative for color change and rash.  Neurological:  Positive for headaches.  All other systems reviewed and are negative.    Physical Exam Triage Vital Signs ED Triage Vitals  Enc Vitals Group     BP  01/02/22 0835 (!) 163/103     Pulse Rate 01/02/22 0835 84     Resp 01/02/22 0835 18     Temp 01/02/22 0835 98.2 F (36.8 C)     Temp src --      SpO2 01/02/22 0835 98 %     Weight --      Height --      Head Circumference --      Peak Flow --      Pain Score 01/02/22 0842 0     Pain Loc --      Pain Edu? --      Excl. in GC? --    No data found.  Updated Vital Signs BP (!) 169/113 (BP Location: Right Arm)   Pulse 84   Temp 98.2 F (36.8 C)   Resp 18   SpO2 98%   Visual Acuity Right Eye Distance:   Left Eye Distance:   Bilateral Distance:    Right Eye Near:   Left Eye Near:    Bilateral Near:     Physical Exam Vitals and nursing note reviewed.  Constitutional:      General: He is not in acute distress.    Appearance: Normal appearance. He is well-developed. He is not ill-appearing.  HENT:     Right Ear: Tympanic membrane normal.     Left Ear: Tympanic membrane normal.     Nose: Rhinorrhea present.     Mouth/Throat:     Mouth: Mucous membranes are moist.     Pharynx: Posterior oropharyngeal erythema present.     Comments: Clear PND Cardiovascular:     Rate and Rhythm: Normal rate and regular rhythm.     Heart sounds: Normal heart sounds.  Pulmonary:     Effort: Pulmonary effort is normal. No respiratory distress.     Breath sounds: Normal breath sounds.  Musculoskeletal:     Cervical back: Neck supple.  Skin:    General: Skin is warm and dry.  Neurological:     Mental Status: He is alert.  Psychiatric:        Mood and Affect: Mood normal.        Behavior: Behavior normal.      UC Treatments / Results  Labs (all labs ordered are listed, but only abnormal results are displayed) Labs Reviewed  POCT RAPID STREP A (OFFICE)    EKG   Radiology No results found.  Procedures Procedures (including critical care time)  Medications Ordered in UC Medications - No data to display  Initial Impression / Assessment and Plan / UC Course  I have  reviewed the triage vital signs and the nursing notes.  Pertinent labs & imaging results that were available during my care  of the patient were reviewed by me and considered in my medical decision making (see chart for details).    Viral illness.  Elevated blood pressure with HTN.  Patient reports history of HTN and is on amlodipine; he is followed by [facility].  Rapid strep negative.  Treating cough with Tessalon Perles.  Discussed other symptomatic treatment including Tylenol or ibuprofen as needed for discomfort.  Instructed patient to follow-up with his PCP if his symptoms are not improving.  Also discussed with patient that his blood pressure is elevated today and needs to be rechecked by PCP in 2 to 4 weeks.  Education provided on managing hypertension.  He agrees to plan of care.   Final Clinical Impressions(s) / UC Diagnoses   Final diagnoses:  Viral illness  Elevated blood pressure reading in office with diagnosis of hypertension     Discharge Instructions      Your strep test is negative.  [facility] as needed for cough.  Follow up with your primary care provider if your symptoms are not improving.    Your blood pressure is elevated today at 163/103; repeat 169/113.  Please have this rechecked by your primary care provider in 1-2 weeks.          ED Prescriptions     Medication Sig Dispense Auth. Provider   benzonatate (TESSALON) 100 MG capsule Take 1 capsule (100 mg total) by mouth 3 (three) times daily as needed for cough. 21 capsule Mickie Bailate, Desteny Freeman H, NP      PDMP not reviewed this encounter.   Mickie Bailate, Tyteanna Ost H, NP 01/02/22 [phone]

## 2022-01-02 NOTE — Discharge Instructions (Addendum)
Your strep test is negative.  Take the [employer] as needed for cough.  Follow up with your primary care provider if your symptoms are not improving.    Your blood pressure is elevated today at 163/103; repeat 169/113.  Please have this rechecked by your primary care provider in 1-2 weeks.

## 2022-01-04 ENCOUNTER — Other Ambulatory Visit: Payer: Self-pay

## 2022-01-04 ENCOUNTER — Encounter: Payer: Self-pay | Admitting: Emergency Medicine

## 2022-01-04 ENCOUNTER — Ambulatory Visit
Admission: EM | Admit: 2022-01-04 | Discharge: 2022-01-04 | Disposition: A | Discharge status: Home / Self Care | Payer: 59 | Attending: Emergency Medicine | Admitting: Emergency Medicine

## 2022-01-04 DIAGNOSIS — J029 Acute pharyngitis, unspecified: Secondary | ICD-10-CM | POA: Diagnosis not present

## 2022-01-04 DIAGNOSIS — J0120 Acute ethmoidal sinusitis, unspecified: Secondary | ICD-10-CM | POA: Diagnosis not present

## 2022-01-04 LAB — POCT RAPID STREP A (OFFICE): Rapid Strep A Screen: NEGATIVE

## 2022-01-04 MED ORDER — PREDNISONE 50 MG PO TABS: ORAL_TABLET | ORAL | 0 refills | Status: DC

## 2022-01-04 MED ORDER — AMOXICILLIN 500 MG PO CAPS: 500.0000 mg | ORAL_CAPSULE | Freq: Three times a day (TID) | ORAL | 0 refills | Status: DC

## 2022-01-04 MED ORDER — PROMETHAZINE-DM 6.25-15 MG/5ML PO SYRP: 5.0000 mL | ORAL_SOLUTION | Freq: Four times a day (QID) | ORAL | 0 refills | Status: DC | PRN

## 2022-01-04 MED ORDER — CETIRIZINE HCL 10 MG PO TABS: 10.0000 mg | ORAL_TABLET | Freq: Every day | ORAL | 0 refills | Status: DC

## 2022-01-04 NOTE — Discharge Instructions (Addendum)
Trial of Zyrtec and steroid burst.  If not improving or worsening symptoms you likely have a sinus infection and you will want to start the antibiotic.  Rapid strep today was negative.  The culture is pending.  Return at anytime for new or worsening symptoms, you will be called with the culture results

## 2022-01-04 NOTE — ED Triage Notes (Signed)
Pt was seen on Thursday and told that he has a virus.   Reports that symptoms have gotten worse. Cough, sore throat, fever.  Alternating tylenol and motrin at home.

## 2022-01-04 NOTE — ED Provider Notes (Signed)
[facility]   MRN: [id number] DOB: [birth year]  Subjective:   Chief Complaint;  Chief Complaint  Patient presents with   Sore Throat   Pt was seen on Thursday and told that he has a virus.   Reports that symptoms have gotten worse. Cough, sore throat, fever.  Alternating tylenol and motrin at home.   Cameron Freeman is a 41 y.o. male presenting for complaints listed above not getting better with viral symptoms  No current facility-administered medications for this encounter.  Current Outpatient Medications:    amLODipine (NORVASC) 5 MG tablet, Take 1 tablet (5 mg total) by mouth daily., Disp: 30 tablet, Rfl: 2   amoxicillin (AMOXIL) 500 MG capsule, Take 1 capsule (500 mg total) by mouth 3 (three) times daily., Disp: 21 capsule, Rfl: 0   benzonatate (TESSALON) 100 MG capsule, Take 1 capsule (100 mg total) by mouth 3 (three) times daily as needed for cough., Disp: 21 capsule, Rfl: 0   cetirizine (ZYRTEC ALLERGY) 10 MG tablet, Take 1 tablet (10 mg total) by mouth daily for 14 days., Disp: 14 tablet, Rfl: 0   meloxicam (MOBIC) 15 MG tablet, Take 15 mg by mouth 3 (three) times daily., Disp: , Rfl:    naproxen (NAPROSYN) 375 MG tablet, Take 1 tablet (375 mg total) by mouth 2 (two) times daily., Disp: 20 tablet, Rfl: 0   predniSONE (DELTASONE) 50 MG tablet, Take 1 pill daily for 5 days as directed, Disp: 5 tablet, Rfl: 0   promethazine-dextromethorphan (PROMETHAZINE-DM) 6.25-15 MG/5ML syrup, Take 5 mLs by mouth 4 (four) times daily as needed for cough., Disp: 180 mL, Rfl: 0   hydrOXYzine (ATARAX/VISTARIL) 25 MG tablet, Take 1 tablet (25 mg total) by mouth every 6 (six) hours., Disp: 12 tablet, Rfl: 0   indapamide (LOZOL) 2.5 MG tablet, TAKE 1 TABLET BY MOUTH EVERY DAY, Disp: 90 tablet, Rfl: 3   No Known Allergies  Past Medical History:  Diagnosis Date   Hypertension    Kidney stones    Microscopic hematuria      Review of Systems  All other systems reviewed and  are negative.    Objective:   Vitals: BP (!) 151/101 (BP Location: Left Arm)   Pulse 69   Temp 99.2 F (37.3 C) (Oral)   Resp 18   Ht 5\' 9"  (1.753 m)   Wt 206 lb (93.4 kg)   SpO2 98%   BMI 30.42 kg/m   Physical Exam Vitals and nursing note reviewed.  Constitutional:      General: He is not in acute distress.    Appearance: He is well-developed.  HENT:     Head: Normocephalic and atraumatic.     Comments: Sinus tenderness diffuse    Mouth/Throat:     Mouth: Mucous membranes are moist.     Pharynx: Posterior oropharyngeal erythema and uvula swelling present.     Tonsils: No tonsillar exudate or tonsillar abscesses.     Comments: Hoarse voice Eyes:     Conjunctiva/sclera: Conjunctivae normal.  Cardiovascular:     Rate and Rhythm: Normal rate and regular rhythm.     Heart sounds: No murmur heard. Pulmonary:     Effort: Pulmonary effort is normal. No respiratory distress.     Breath sounds: Normal breath sounds.  Abdominal:     Palpations: Abdomen is soft.     Tenderness: There is no abdominal tenderness.  Musculoskeletal:        General: No swelling.  Cervical back: Neck supple.  Skin:    General: Skin is warm and dry.     Capillary Refill: Capillary refill takes less than 2 seconds.  Neurological:     Mental Status: He is alert.  Psychiatric:        Mood and Affect: Mood normal.     Results for orders placed or performed during the hospital encounter of 01/04/22 (from the past 24 hour(s))  POCT rapid strep A     Status: None   Collection Time: 01/04/22 11:09 AM  Result Value Ref Range   Rapid Strep A Screen Negative Negative    No results found.     Assessment and Plan :   1. Acute non-recurrent ethmoidal sinusitis   2. Acute pharyngitis, unspecified etiology     Meds ordered this encounter  Medications   amoxicillin (AMOXIL) 500 MG capsule    Sig: Take 1 capsule (500 mg total) by mouth 3 (three) times daily.    Dispense:  21 capsule     Refill:  0   predniSONE (DELTASONE) 50 MG tablet    Sig: Take 1 pill daily for 5 days as directed    Dispense:  5 tablet    Refill:  0   promethazine-dextromethorphan (PROMETHAZINE-DM) 6.25-15 MG/5ML syrup    Sig: Take 5 mLs by mouth 4 (four) times daily as needed for cough.    Dispense:  180 mL    Refill:  0   cetirizine (ZYRTEC ALLERGY) 10 MG tablet    Sig: Take 1 tablet (10 mg total) by mouth daily for 14 days.    Dispense:  14 tablet    Refill:  0    MDM:  Cameron Freeman is a 41 y.o. male presenting for upper respiratory symptoms which were likely viral however his symptoms changed over the last several days with purulent discharge and spiking of fevers.  We discussed using steroid and antihistamine as well as steam treatments for the congestion if not improving patient will start antibiotics.  I discussed treatment, follow up and return instructions. Questions were answered. Patient/representative stated understanding of instructions and patient is stable for discharge.  Dewaine Conger FNP-C MCN    Jone Baseman, NP 01/04/22 1121

## 2022-01-07 LAB — CULTURE, GROUP A STREP (THRC)

## 2022-03-06 HISTORY — PX: WRIST SURGERY: SHX841

## 2022-05-09 ENCOUNTER — Ambulatory Visit
Admission: EM | Admit: 2022-05-09 | Discharge: 2022-05-09 | Disposition: A | Discharge status: Home / Self Care | Payer: 59 | Attending: Urgent Care | Admitting: Urgent Care

## 2022-05-09 DIAGNOSIS — J069 Acute upper respiratory infection, unspecified: Secondary | ICD-10-CM | POA: Diagnosis present

## 2022-05-09 DIAGNOSIS — J019 Acute sinusitis, unspecified: Secondary | ICD-10-CM | POA: Insufficient documentation

## 2022-05-09 DIAGNOSIS — Z1152 Encounter for screening for COVID-19: Secondary | ICD-10-CM | POA: Diagnosis not present

## 2022-05-09 LAB — RESP PANEL BY RT-PCR (RSV, FLU A&B, COVID)  RVPGX2
Influenza A by PCR: NEGATIVE
Influenza B by PCR: NEGATIVE
Resp Syncytial Virus by PCR: NEGATIVE
SARS Coronavirus 2 by RT PCR: NEGATIVE

## 2022-05-09 MED ORDER — BENZONATATE 100 MG PO CAPS: ORAL_CAPSULE | ORAL | 0 refills | Status: DC

## 2022-05-09 MED ORDER — METHYLPREDNISOLONE 4 MG PO TBPK: ORAL_TABLET | ORAL | 0 refills | Status: DC

## 2022-05-09 NOTE — ED Provider Notes (Signed)
[name]    CSN: [id number] Arrival date & time: 05/09/22  7341      History   Chief Complaint Chief Complaint  Patient presents with   Nasal Congestion   Cough   Facial Pain    HPI Cameron Freeman is a 41 y.o. male.    Cough   Presents to UC with complaint of nasal congestion productive of yellow mucus, sinus pressure/pain, facial, tooth pain, cough productive of yellow sputum starting yesterday.  Denies fever, myalgias, chills, GI symptoms.  Past Medical History:  Diagnosis Date   Hypertension    Kidney stones    Microscopic hematuria     Patient Active Problem List   Diagnosis Date Noted   Hydronephrosis 06/13/2018   Chronic renal impairment, stage 3 (moderate) (HCC) 04/14/2016   Left ureteral stone 09/13/2015   Hydronephrosis with urinary obstruction due to ureteral calculus 09/13/2015   History of renal stone 09/01/2015   Microscopic hematuria 09/01/2015    Past Surgical History:  Procedure Laterality Date   CYSTOSCOPY WITH STENT PLACEMENT Right 06/14/2018   Procedure: CYSTOSCOPY WITH STENT PLACEMENT;  Surgeon: Vanna Scotland, MD;  Location: [facility];  Service: Urology;  Laterality: Right;   EXTRACORPOREAL SHOCK WAVE LITHOTRIPSY Left 09/20/2015   Procedure: EXTRACORPOREAL SHOCK WAVE LITHOTRIPSY (ESWL);  Surgeon: Hildred Laser, MD;  Location: [facility];  Service: Urology;  Laterality: Left;   NO PAST SURGERIES     none     URETEROSCOPY WITH HOLMIUM LASER LITHOTRIPSY Right 06/14/2018   Procedure: URETEROSCOPY WITH HOLMIUM LASER LITHOTRIPSY;  Surgeon: Vanna Scotland, MD;  Location: [facility];  Service: Urology;  Laterality: Right;       Home Medications    Prior to Admission medications   Medication Sig Start Date End Date Taking? Authorizing Provider  amLODipine (NORVASC) 5 MG tablet Take 1 tablet (5 mg total) by mouth daily. 06/15/18   Enid Baas, MD  amoxicillin (AMOXIL) 500 MG capsule Take 1 capsule (500 mg total) by  mouth 3 (three) times daily. 01/04/22   Jone Baseman, NP  benzonatate (TESSALON) 100 MG capsule Take 1 capsule (100 mg total) by mouth 3 (three) times daily as needed for cough. 01/02/22   Mickie Bail, NP  cetirizine (ZYRTEC ALLERGY) 10 MG tablet Take 1 tablet (10 mg total) by mouth daily for 14 days. 01/04/22 01/18/22  Jone Baseman, NP  hydrOXYzine (ATARAX/VISTARIL) 25 MG tablet Take 1 tablet (25 mg total) by mouth every 6 (six) hours. 07/27/20   Hall-Potvin, [place], PA-C  indapamide (LOZOL) 2.5 MG tablet TAKE 1 TABLET BY MOUTH EVERY DAY 11/25/18   McGowan, Carollee Herter A, PA-C  meloxicam (MOBIC) 15 MG tablet Take 15 mg by mouth 3 (three) times daily. 12/10/21   [provider]  naproxen (NAPROSYN) 375 MG tablet Take 1 tablet (375 mg total) by mouth 2 (two) times daily. 10/17/21   Bing Neighbors, FNP  oxyCODONE (OXY IR/ROXICODONE) 5 MG immediate release tablet Take 5 mg by mouth every 6 (six) hours as needed. 03/13/22   [provider]  predniSONE (DELTASONE) 50 MG tablet Take 1 pill daily for 5 days as directed 01/04/22   Jone Baseman, NP  promethazine-dextromethorphan (PROMETHAZINE-DM) 6.25-15 MG/5ML syrup Take 5 mLs by mouth 4 (four) times daily as needed for cough. 01/04/22   Jone Baseman, NP    Family History Family History  Problem Relation Age of Onset   Kidney disease Maternal Aunt  dialysis   Lung cancer Father    Kidney disease Mother        one kidney   Kidney Stones Other    Prostate cancer Neg Hx     Social History Social History   Tobacco Use   Smoking status: Every Day    Types: Cigars   Smokeless tobacco: Never  Vaping Use   Vaping Use: Never used  Substance Use Topics   Alcohol use: Yes    Alcohol/week: 0.0 standard drinks of alcohol    Comment: liquor and beer   Drug use: Yes    Types: Marijuana     Allergies   Patient has no known allergies.   Review of Systems Review of Systems  Respiratory:  Positive for cough.       Physical Exam Triage Vital Signs ED Triage Vitals  Enc Vitals Group     BP      Pulse      Resp      Temp      Temp src      SpO2      Weight      Height      Head Circumference      Peak Flow      Pain Score      Pain Loc      Pain Edu?      Excl. in GC?    No data found.  Updated Vital Signs There were no vitals taken for this visit.  Visual Acuity Right Eye Distance:   Left Eye Distance:   Bilateral Distance:    Right Eye Near:   Left Eye Near:    Bilateral Near:     Physical Exam Vitals reviewed.  Constitutional:      Appearance: Normal appearance. He is ill-appearing.  HENT:     Nose: Congestion present.     Right Sinus: Maxillary sinus tenderness present.     Left Sinus: Maxillary sinus tenderness present.     Mouth/Throat:     Mouth: Mucous membranes are moist.     Pharynx: No posterior oropharyngeal erythema.  Cardiovascular:     Rate and Rhythm: Normal rate and regular rhythm.  Pulmonary:     Effort: Pulmonary effort is normal.     Breath sounds: Normal breath sounds.  Skin:    General: Skin is warm and dry.  Neurological:     General: No focal deficit present.     Mental Status: He is alert and oriented to person, place, and time.  Psychiatric:        Mood and Affect: Mood normal.        Behavior: Behavior normal.      UC Treatments / Results  Labs (all labs ordered are listed, but only abnormal results are displayed) Labs Reviewed - No data to display  EKG   Radiology No results found.  Procedures Procedures (including critical care time)  Medications Ordered in UC Medications - No data to display  Initial Impression / Assessment and Plan / UC Course  I have reviewed the triage vital signs and the nursing notes.  Pertinent labs & imaging results that were available during my care of the patient were reviewed by me and considered in my medical decision making (see chart for details).   Given duration of symptoms,  suspect viral etiology to his URI symptoms.  Nasal swab obtained and pending.  Will treat acute sinusitis with Medrol dose pack.  Cough treated with benzonatate.  Otherwise may take OTC medication for symptom control.   Final Clinical Impressions(s) / UC Diagnoses   Final diagnoses:  None   Discharge Instructions   None    ED Prescriptions   None    PDMP not reviewed this encounter.   Rose Phi, [place] 05/09/22 1026

## 2022-05-09 NOTE — Discharge Instructions (Addendum)
Follow up here or with your primary care provider if your symptoms are worsening or not improving with treatment.     

## 2022-05-09 NOTE — ED Triage Notes (Signed)
Pt. Presents to UC w/ Nasal congestion, cough and sinus pressure that stared yesterday. Pt. Has been treating himself w/ OTC medication.

## 2022-06-28 ENCOUNTER — Ambulatory Visit
Admission: EM | Admit: 2022-06-28 | Discharge: 2022-06-28 | Disposition: A | Discharge status: Home / Self Care | Payer: 59 | Attending: Emergency Medicine | Admitting: Emergency Medicine

## 2022-06-28 DIAGNOSIS — I10 Essential (primary) hypertension: Secondary | ICD-10-CM

## 2022-06-28 DIAGNOSIS — J0100 Acute maxillary sinusitis, unspecified: Secondary | ICD-10-CM

## 2022-06-28 DIAGNOSIS — H6693 Otitis media, unspecified, bilateral: Secondary | ICD-10-CM

## 2022-06-28 MED ORDER — AMOXICILLIN 875 MG PO TABS: 875.0000 mg | ORAL_TABLET | Freq: Two times a day (BID) | ORAL | 0 refills | Status: AC

## 2022-06-28 NOTE — Discharge Instructions (Addendum)
Take the amoxicillin as directed. Follow up with your primary care provider if your symptoms are not improving.   Your blood pressure is elevated today at 140/96.  Please have this rechecked by your primary care provider in 2-4 weeks.

## 2022-06-28 NOTE — ED Triage Notes (Signed)
Patient to Urgent Care with complaints of sore throat, cough, and nasal congestion/ yellow drainage x1week.

## 2022-06-28 NOTE — ED Provider Notes (Signed)
Cameron Freeman    CSN: [id number] Arrival date & time: 06/28/22  2947      History   Chief Complaint Chief Complaint  Patient presents with   Sore Throat   Cough   Nasal Congestion    HPI Cameron Freeman is a 41 y.o. male.  Patient presents with 1 week history of congestion, sore throat, cough.  He developed ear discomfort yesterday.  No fever, rash, shortness of breath, vomiting, diarrhea, or other symptoms.  No OTC medications taken.  Patient was seen at this urgent care on 05/09/2022; diagnosed with viral URI with cough and rhinosinusitis; treated with methylprednisolone and benzonatate.  His medical history includes kidney stones, hypertension, CKD, hydronephrosis.  The history is provided by the patient and medical records.    Past Medical History:  Diagnosis Date   Hypertension    Kidney stones    Microscopic hematuria     Patient Active Problem List   Diagnosis Date Noted   Hydronephrosis 06/13/2018   Chronic renal impairment, stage 3 (moderate) (HCC) 04/14/2016   Left ureteral stone 09/13/2015   Hydronephrosis with urinary obstruction due to ureteral calculus 09/13/2015   History of renal stone 09/01/2015   Microscopic hematuria 09/01/2015    Past Surgical History:  Procedure Laterality Date   CYSTOSCOPY WITH STENT PLACEMENT Right 06/14/2018   Procedure: CYSTOSCOPY WITH STENT PLACEMENT;  Surgeon: Vanna Scotland, MD;  Location: [facility];  Service: Urology;  Laterality: Right;   EXTRACORPOREAL SHOCK WAVE LITHOTRIPSY Left 09/20/2015   Procedure: EXTRACORPOREAL SHOCK WAVE LITHOTRIPSY (ESWL);  Surgeon: Hildred Laser, MD;  Location: [facility];  Service: Urology;  Laterality: Left;   NO PAST SURGERIES     none     URETEROSCOPY WITH HOLMIUM LASER LITHOTRIPSY Right 06/14/2018   Procedure: URETEROSCOPY WITH HOLMIUM LASER LITHOTRIPSY;  Surgeon: Vanna Scotland, MD;  Location: [facility];  Service: Urology;  Laterality: Right;   WRIST SURGERY  03/06/2022        Home Medications    Prior to Admission medications   Medication Sig Start Date End Date Taking? Authorizing Provider  amoxicillin (AMOXIL) 875 MG tablet Take 1 tablet (875 mg total) by mouth 2 (two) times daily for 10 days. 06/28/22 07/08/22 Yes Mickie Bail, NP  amLODipine (NORVASC) 5 MG tablet Take 1 tablet (5 mg total) by mouth daily. 06/15/18   Enid Baas, MD  cetirizine (ZYRTEC ALLERGY) 10 MG tablet Take 1 tablet (10 mg total) by mouth daily for 14 days. 01/04/22 01/18/22  Jone Baseman, NP  hydrOXYzine (ATARAX/VISTARIL) 25 MG tablet Take 1 tablet (25 mg total) by mouth every 6 (six) hours. 07/27/20   Hall-Potvin, [place], PA-C  indapamide (LOZOL) 2.5 MG tablet TAKE 1 TABLET BY MOUTH EVERY DAY 11/25/18   McGowan, Carollee Herter A, PA-C  meloxicam (MOBIC) 15 MG tablet Take 15 mg by mouth 3 (three) times daily. 12/10/21   [provider]  naproxen (NAPROSYN) 375 MG tablet Take 1 tablet (375 mg total) by mouth 2 (two) times daily. 10/17/21   Bing Neighbors, FNP    Family History Family History  Problem Relation Age of Onset   Kidney disease Maternal Aunt        dialysis   Lung cancer Father    Kidney disease Mother        one kidney   Kidney Stones Other    Prostate cancer Neg Hx     Social History Social History   Tobacco Use   Smoking status:  Every Day    Types: Cigars   Smokeless tobacco: Never  Vaping Use   Vaping Use: Never used  Substance Use Topics   Alcohol use: Yes    Alcohol/week: 0.0 standard drinks of alcohol    Comment: liquor and beer   Drug use: Yes    Types: Marijuana     Allergies   Patient has no known allergies.   Review of Systems Review of Systems  Constitutional:  Negative for chills and fever.  HENT:  Positive for congestion, ear pain and sore throat.   Respiratory:  Positive for cough. Negative for shortness of breath.   Cardiovascular:  Negative for chest pain and palpitations.  Gastrointestinal:  Negative for  diarrhea and vomiting.  Skin:  Negative for rash.  All other systems reviewed and are negative.    Physical Exam Triage Vital Signs ED Triage Vitals  Enc Vitals Group     BP 06/28/22 0940 (!) 140/96     Pulse Rate 06/28/22 0940 76     Resp 06/28/22 0940 16     Temp 06/28/22 0940 98.1 F (36.7 C)     Temp src --      SpO2 --      Weight 06/28/22 0946 218 lb (98.9 kg)     Height 06/28/22 0946 5\' 9"  (1.753 m)     Head Circumference --      Peak Flow --      Pain Score 06/28/22 0945 6     Pain Loc --      Pain Edu? --      Excl. in [place]? --    No data found.  Updated Vital Signs BP (!) 140/96   Pulse 76   Temp 98.1 F (36.7 C)   Resp 16   Ht 5\' 9"  (1.753 m)   Wt 218 lb (98.9 kg)   BMI 32.19 kg/m   Visual Acuity Right Eye Distance:   Left Eye Distance:   Bilateral Distance:    Right Eye Near:   Left Eye Near:    Bilateral Near:     Physical Exam Vitals and nursing note reviewed.  Constitutional:      General: He is not in acute distress.    Appearance: Normal appearance. He is well-developed. He is not ill-appearing.  HENT:     Right Ear: Tympanic membrane is erythematous.     Left Ear: Tympanic membrane is erythematous.     Nose: Congestion present.     Mouth/Throat:     Mouth: Mucous membranes are moist.     Pharynx: Oropharynx is clear.  Cardiovascular:     Rate and Rhythm: Normal rate and regular rhythm.     Heart sounds: Normal heart sounds.  Pulmonary:     Effort: Pulmonary effort is normal. No respiratory distress.     Breath sounds: Normal breath sounds.  Musculoskeletal:     Cervical back: Neck supple.  Skin:    General: Skin is warm and dry.  Neurological:     Mental Status: He is alert.  Psychiatric:        Mood and Affect: Mood normal.        Behavior: Behavior normal.      UC Treatments / Results  Labs (all labs ordered are listed, but only abnormal results are displayed) Labs Reviewed - No data to  display  EKG   Radiology No results found.  Procedures Procedures (including critical care time)  Medications Ordered in UC Medications -  No data to display  Initial Impression / Assessment and Plan / UC Course  I have reviewed the triage vital signs and the nursing notes.  Pertinent labs & imaging results that were available during my care of the patient were reviewed by me and considered in my medical decision making (see chart for details).    Acute sinusitis, bilateral otitis media. Elevated blood pressure with HTN.  Treating with amoxicillin.  Discussed symptomatic treatment including Tylenol or ibuprofen, rest, hydration.  Instructed patient to follow up with PCP if symptoms are not improving.  Also discussed with patient that his blood pressure is elevated today and needs to be rechecked by PCP in 2 to 4 weeks.  Education provided on managing hypertension.  He agrees to plan of care.   Final Clinical Impressions(s) / UC Diagnoses   Final diagnoses:  Acute non-recurrent maxillary sinusitis  Bilateral otitis media, unspecified otitis media type  Elevated blood pressure reading in office with diagnosis of hypertension     Discharge Instructions      Take the amoxicillin as directed. Follow up with your primary care provider if your symptoms are not improving.   Your blood pressure is elevated today at 140/96.  Please have this rechecked by your primary care provider in 2-4 weeks.          ED Prescriptions     Medication Sig Dispense Auth. Provider   amoxicillin (AMOXIL) 875 MG tablet Take 1 tablet (875 mg total) by mouth 2 (two) times daily for 10 days. 20 tablet Mickie Bail, NP      PDMP not reviewed this encounter.   Mickie Bail, NP 06/28/22 1012

## 2022-06-29 ENCOUNTER — Ambulatory Visit: Payer: Self-pay

## 2022-09-04 ENCOUNTER — Ambulatory Visit
Admission: EM | Admit: 2022-09-04 | Discharge: 2022-09-04 | Disposition: A | Discharge status: Home / Self Care | Payer: 59 | Attending: Urgent Care | Admitting: Urgent Care

## 2022-09-04 DIAGNOSIS — R6889 Other general symptoms and signs: Secondary | ICD-10-CM | POA: Diagnosis not present

## 2022-09-04 LAB — POCT RAPID STREP A (OFFICE): Rapid Strep A Screen: NEGATIVE

## 2022-09-04 MED ORDER — OSELTAMIVIR PHOSPHATE 75 MG PO CAPS: 75.0000 mg | ORAL_CAPSULE | Freq: Two times a day (BID) | ORAL | 0 refills | Status: DC

## 2022-09-04 NOTE — ED Triage Notes (Addendum)
Patient to Urgent Care with complaints of sore throat, productive cough (clear mucus), headache, possible fevers, and nasal congestion.  Symptoms started two days ago. Taking tylenol every four hours.

## 2022-09-04 NOTE — ED Provider Notes (Signed)
Cameron Freeman    CSN: [id number] Arrival date & time: 09/04/22  0840      History   Chief Complaint Chief Complaint  Patient presents with   Headache   Fever   Nasal Congestion    HPI Cameron Freeman is a 42 y.o. male.    Headache Associated symptoms: fever   Fever Associated symptoms: headaches     Presents to urgent care with complaint of sore throat, cough productive of clear sputum, headache, fever (undocumented) nasal congestion.  Symptoms starting Tuesday (less than 2 days ago).  He is using only Tylenol to control his symptoms and states that it does not adequately control his headache.  He is concerned because of small children at home.    Past Surgical History:  Procedure Laterality Date   CYSTOSCOPY WITH STENT PLACEMENT Right 06/14/2018   Procedure: CYSTOSCOPY WITH STENT PLACEMENT;  Surgeon: Hollice Espy, MD;  Location: [facility];  Service: Urology;  Laterality: Right;   EXTRACORPOREAL SHOCK WAVE LITHOTRIPSY Left 09/20/2015   Procedure: EXTRACORPOREAL SHOCK WAVE LITHOTRIPSY (ESWL);  Surgeon: Nickie Retort, MD;  Location: [facility];  Service: Urology;  Laterality: Left;   NO PAST SURGERIES     none     URETEROSCOPY WITH HOLMIUM LASER LITHOTRIPSY Right 06/14/2018   Procedure: URETEROSCOPY WITH HOLMIUM LASER LITHOTRIPSY;  Surgeon: Hollice Espy, MD;  Location: [facility];  Service: Urology;  Laterality: Right;   WRIST SURGERY  03/06/2022       Home Medications    Prior to Admission medications   Medication Sig Start Date End Date Taking? Authorizing Provider  amLODipine (NORVASC) 5 MG tablet Take 1 tablet (5 mg total) by mouth daily. 06/15/18   Gladstone Lighter, MD  cetirizine (ZYRTEC ALLERGY) 10 MG tablet Take 1 tablet (10 mg total) by mouth daily for 14 days. 01/04/22 01/18/22  Hezzie Bump, NP  hydrOXYzine (ATARAX/VISTARIL) 25 MG tablet Take 1 tablet (25 mg total) by mouth every 6 (six) hours. 07/27/20   Hall-Potvin, [place], PA-C   indapamide (LOZOL) 2.5 MG tablet TAKE 1 TABLET BY MOUTH EVERY DAY 11/25/18   McGowan, Larene Beach A, PA-C  meloxicam (MOBIC) 15 MG tablet Take 15 mg by mouth 3 (three) times daily. 12/10/21   [provider]  naproxen (NAPROSYN) 375 MG tablet Take 1 tablet (375 mg total) by mouth 2 (two) times daily. 10/17/21   Scot Jun, NP    Family History Family History  Problem Relation Age of Onset   Kidney disease Maternal Aunt        dialysis   Lung cancer Father    Kidney disease Mother        one kidney   Kidney Stones Other    Prostate cancer Neg Hx     Social History Social History   Tobacco Use   Smoking status: Former    Types: Cigars   Smokeless tobacco: Never  Vaping Use   Vaping Use: Never used  Substance Use Topics   Alcohol use: Not Currently    Comment: liquor and beer   Drug use: Not Currently    Types: Marijuana     Allergies   Patient has no known allergies.   Review of Systems Review of Systems  Constitutional:  Positive for fever.  Neurological:  Positive for headaches.     Physical Exam Triage Vital Signs ED Triage Vitals  Enc Vitals Group     BP 09/04/22 0902 (!) 143/86     Pulse Rate  09/04/22 0902 77     Resp 09/04/22 0902 18     Temp 09/04/22 0902 98.4 F (36.9 C)     Temp src --      SpO2 09/04/22 0902 98 %     Weight --      Height --      Head Circumference --      Peak Flow --      Pain Score 09/04/22 0900 6     Pain Loc --      Pain Edu? --      Excl. in [place]? --    No data found.  Updated Vital Signs BP (!) 143/86   Pulse 77   Temp 98.4 F (36.9 C)   Resp 18   SpO2 98%   Visual Acuity Right Eye Distance:   Left Eye Distance:   Bilateral Distance:    Right Eye Near:   Left Eye Near:    Bilateral Near:     Physical Exam Vitals reviewed.  Constitutional:      Appearance: He is well-developed. He is ill-appearing.  HENT:     Mouth/Throat:     Pharynx: Posterior oropharyngeal erythema present. No  oropharyngeal exudate.     Tonsils: No tonsillar exudate.  Cardiovascular:     Rate and Rhythm: Normal rate and regular rhythm.     Heart sounds: Normal heart sounds.  Pulmonary:     Effort: Pulmonary effort is normal.     Breath sounds: Normal breath sounds.  Skin:    General: Skin is warm and dry.  Neurological:     Mental Status: He is alert and oriented to person, place, and time.  Psychiatric:        Mood and Affect: Mood normal.        Behavior: Behavior normal.      UC Treatments / Results  Labs (all labs ordered are listed, but only abnormal results are displayed) Labs Reviewed  POCT RAPID STREP A (OFFICE)    EKG   Radiology No results found.  Procedures Procedures (including critical care time)  Medications Ordered in UC Medications - No data to display  Initial Impression / Assessment and Plan / UC Course  I have reviewed the triage vital signs and the nursing notes.  Pertinent labs & imaging results that were available during my care of the patient were reviewed by me and considered in my medical decision making (see chart for details).   Patient is afebrile here without recent antipyretics. Satting well on room air. Overall is ill appearing, well hydrated, without respiratory distress. Pulmonary exam is unremarkable.  Lungs CTAB without wheezing, rhonchi, rales.  Mild pharyngeal erythema without peritonsillar exudates.  Patient's symptoms are most consistent with an acute viral process including influenza.  Given he is within the treatment window for influenza, will treat presumptively with Tamiflu while continuing to recommend that he use OTC medication for symptom control.  Final Clinical Impressions(s) / UC Diagnoses   Final diagnoses:  None   Discharge Instructions   None    ED Prescriptions   None    PDMP not reviewed this encounter.   Rose Phi, Wing 09/04/22 [phone]

## 2022-09-04 NOTE — Discharge Instructions (Addendum)
You have been diagnosed with a viral upper respiratory infection based on your symptoms and exam. Viral illnesses cannot be treated with antibiotics - they are self limiting - and you should find your symptoms resolving within a few days. Get plenty of rest and non-caffeinated fluids. Watch for signs of dehydration including reduced urine output and dark colored urine.  I have prescribed Tamiflu, antiviral therapy for influenza A, based on a presumptive diagnosis of influenza.  We recommend you use over-the-counter medications for symptom control including acetaminophen (Tylenol), ibuprofen (Advil/Motrin) or naproxen (Aleve) for throat pain, fever, chills or body aches. You may combine use of acetaminophen and ibuprofen/naproxen if needed.  Some patients find an pain-relieving throat spray such as Chloraseptic to be effective.  Also recommend age-appropriate cold/cough medication containing a cough suppressant such as dextromethorphan, as needed. Please note that some cough medications are not recommended if you suffer from hypertension.    Saline mist spray is helpful for removing excess mucus from your nose.  Room humidifiers are helpful to ease breathing at night. I recommend guaifenesin (Mucinex) with plenty of water throughout the day to help thin and loosen mucus secretions in your respiratory passages.   If appropriate based upon your other medical problems, you might also find relief of nasal/sinus congestion symptoms by using a nasal decongestant such as fluticasone (Flonase ) or pseudoephedrine (Sudafed sinus).  You will need to obtain Sudafed from behind the pharmacist counter.  Speak to the pharmacist to verify that you are not duplicating medications with other over-the-counter formulations that you may be using.

## 2022-11-06 ENCOUNTER — Encounter: Payer: Self-pay | Admitting: Emergency Medicine

## 2022-11-06 ENCOUNTER — Ambulatory Visit
Admission: EM | Admit: 2022-11-06 | Discharge: 2022-11-06 | Disposition: A | Discharge status: Home / Self Care | Payer: 59

## 2022-11-06 ENCOUNTER — Emergency Department: Payer: 59

## 2022-11-06 ENCOUNTER — Emergency Department
Admission: EM | Admit: 2022-11-06 | Discharge: 2022-11-06 | Disposition: A | Discharge status: Home / Self Care | Payer: 59 | Attending: Emergency Medicine | Admitting: Emergency Medicine

## 2022-11-06 ENCOUNTER — Other Ambulatory Visit: Payer: Self-pay

## 2022-11-06 DIAGNOSIS — Z79899 Other long term (current) drug therapy: Secondary | ICD-10-CM | POA: Diagnosis not present

## 2022-11-06 DIAGNOSIS — Z1152 Encounter for screening for COVID-19: Secondary | ICD-10-CM | POA: Diagnosis not present

## 2022-11-06 DIAGNOSIS — D72829 Elevated white blood cell count, unspecified: Secondary | ICD-10-CM | POA: Diagnosis not present

## 2022-11-06 DIAGNOSIS — J019 Acute sinusitis, unspecified: Secondary | ICD-10-CM | POA: Diagnosis not present

## 2022-11-06 DIAGNOSIS — I10 Essential (primary) hypertension: Secondary | ICD-10-CM | POA: Insufficient documentation

## 2022-11-06 DIAGNOSIS — I160 Hypertensive urgency: Secondary | ICD-10-CM

## 2022-11-06 DIAGNOSIS — R058 Other specified cough: Secondary | ICD-10-CM

## 2022-11-06 DIAGNOSIS — R0602 Shortness of breath: Secondary | ICD-10-CM

## 2022-11-06 DIAGNOSIS — R519 Headache, unspecified: Secondary | ICD-10-CM

## 2022-11-06 LAB — CBC WITH DIFFERENTIAL/PLATELET
Abs Immature Granulocytes: 0.07 10*3/uL (ref 0.00–0.07)
Basophils Absolute: 0 10*3/uL (ref 0.0–0.1)
Basophils Relative: 0 %
Eosinophils Absolute: 0.2 10*3/uL (ref 0.0–0.5)
Eosinophils Relative: 1 %
HCT: 48.9 % (ref 39.0–52.0)
Hemoglobin: 16.1 g/dL (ref 13.0–17.0)
Immature Granulocytes: 0 %
Lymphocytes Relative: 14 %
Lymphs Abs: 2.4 10*3/uL (ref 0.7–4.0)
MCH: 29.7 pg (ref 26.0–34.0)
MCHC: 32.9 g/dL (ref 30.0–36.0)
MCV: 90.2 fL (ref 80.0–100.0)
Monocytes Absolute: 1.1 10*3/uL — ABNORMAL HIGH (ref 0.1–1.0)
Monocytes Relative: 6 %
Neutro Abs: 13.7 10*3/uL — ABNORMAL HIGH (ref 1.7–7.7)
Neutrophils Relative %: 79 %
Platelets: 178 10*3/uL (ref 150–400)
RBC: 5.42 MIL/uL (ref 4.22–5.81)
RDW: 12.6 % (ref 11.5–15.5)
WBC: 17.5 10*3/uL — ABNORMAL HIGH (ref 4.0–10.5)
nRBC: 0 % (ref 0.0–0.2)

## 2022-11-06 LAB — BASIC METABOLIC PANEL
Anion gap: 10 (ref 5–15)
BUN: 14 mg/dL (ref 6–20)
CO2: 24 mmol/L (ref 22–32)
Calcium: 9 mg/dL (ref 8.9–10.3)
Chloride: 105 mmol/L (ref 98–111)
Creatinine, Ser: 1.26 mg/dL — ABNORMAL HIGH (ref 0.61–1.24)
GFR, Estimated: >60 mL/min (ref >60)
Glucose, Bld: 89 mg/dL (ref 70–99)
Potassium: 3.7 mmol/L (ref 3.5–5.1)
Sodium: 139 mmol/L (ref 135–145)

## 2022-11-06 LAB — SARS CORONAVIRUS 2 BY RT PCR: SARS Coronavirus 2 by RT PCR: NEGATIVE

## 2022-11-06 LAB — TROPONIN I (HIGH SENSITIVITY): Troponin I (High Sensitivity): 7 ng/L (ref <18)

## 2022-11-06 MED ORDER — IBUPROFEN 600 MG PO TABS
600.0000 mg | ORAL_TABLET | Freq: Once | ORAL | Status: AC
  Administered 2022-11-06: 600 mg via ORAL
  Filled 2022-11-06: qty 1

## 2022-11-06 MED ORDER — AMOXICILLIN-POT CLAVULANATE 875-125 MG PO TABS: 1.0000 | ORAL_TABLET | Freq: Two times a day (BID) | ORAL | 0 refills | Status: AC

## 2022-11-06 MED ORDER — METOCLOPRAMIDE HCL 10 MG PO TABS: 10.0000 mg | ORAL_TABLET | Freq: Three times a day (TID) | ORAL | 0 refills | Status: DC | PRN

## 2022-11-06 MED ORDER — CLONIDINE HCL 0.1 MG PO TABS
0.2000 mg | ORAL_TABLET | Freq: Once | ORAL | Status: AC
  Administered 2022-11-06: 0.2 mg via ORAL
  Filled 2022-11-06: qty 2

## 2022-11-06 MED ORDER — METOCLOPRAMIDE HCL 5 MG/ML IJ SOLN
10.0000 mg | Freq: Once | INTRAMUSCULAR | Status: AC
  Administered 2022-11-06: 10 mg via INTRAMUSCULAR
  Filled 2022-11-06: qty 2

## 2022-11-06 MED ORDER — FLUTICASONE PROPIONATE 50 MCG/ACT NA SUSP: 1.0000 | Freq: Every day | NASAL | 0 refills | Status: DC

## 2022-11-06 NOTE — Discharge Instructions (Addendum)
To the emergency department for evaluation of your very high blood pressure, severe headache, shortness of breath, productive cough, and other symptoms.  Blood pressure 177/121 ; repeat 164/118

## 2022-11-06 NOTE — ED Triage Notes (Signed)
Pt said he has been having sinus pressure and pain. Yesterday had headache and some congestion in his chest. Pt said SOB and is coughing up phlegm with a tan color.

## 2022-11-06 NOTE — ED Triage Notes (Signed)
Pt to ED POV for HTN and HA. Pt went to UC today for what he thought was sinus infection since 2-3 days and they took BP which was 164/118 and told him to come to ER. Pt denies vision changes but endorses 7/10 HA to top posterior head since yesterday. Takes amlodipine 5mg  once daily but has not taken for last 2 days because was feeling sick.

## 2022-11-06 NOTE — ED Notes (Signed)
Pt has been referred by provider to go to the ER due to hypertensive urgency , severe headache and a productive cough.

## 2022-11-06 NOTE — ED Provider Notes (Signed)
[facility]    CSN: [id number] Arrival date & time: 11/06/22  1110      History   Chief Complaint Chief Complaint  Patient presents with   Facial Pain   Nasal Congestion    HPI Cameron Freeman is a 42 y.o. male.  Patient presents with 1 day history of congestion, sinus pressure, productive cough, shortness of breath.  He states he is coughing up tan sputum.  He reports severe headache.   Treating with ibuprofen; last taken this morning.  He denies focal weakness, chest pain, fever, or other symptoms.  His medical history includes hypertension, kidney stones, chronic renal impairment, hydronephrosis.  He has not taken his blood pressure medication "in a few days" because he was not feeling well.  The history is provided by the patient and medical records.    Past Medical History:  Diagnosis Date   Hypertension    Kidney stones    Microscopic hematuria     Patient Active Problem List   Diagnosis Date Noted   Hydronephrosis 06/13/2018   Chronic renal impairment, stage 3 (moderate) 04/14/2016   Left ureteral stone 09/13/2015   Hydronephrosis with urinary obstruction due to ureteral calculus 09/13/2015   History of renal stone 09/01/2015   Microscopic hematuria 09/01/2015    Past Surgical History:  Procedure Laterality Date   CYSTOSCOPY WITH STENT PLACEMENT Right 06/14/2018   Procedure: CYSTOSCOPY WITH STENT PLACEMENT;  Surgeon: Vanna ScotlandBrandon, Ashley, MD;  Location: [facility];  Service: Urology;  Laterality: Right;   EXTRACORPOREAL SHOCK WAVE LITHOTRIPSY Left 09/20/2015   Procedure: EXTRACORPOREAL SHOCK WAVE LITHOTRIPSY (ESWL);  Surgeon: Hildred LaserBrian James Budzyn, MD;  Location: [facility];  Service: Urology;  Laterality: Left;   NO PAST SURGERIES     none     URETEROSCOPY WITH HOLMIUM LASER LITHOTRIPSY Right 06/14/2018   Procedure: URETEROSCOPY WITH HOLMIUM LASER LITHOTRIPSY;  Surgeon: Vanna ScotlandBrandon, Ashley, MD;  Location: [facility];  Service: Urology;  Laterality: Right;   WRIST  SURGERY  03/06/2022       Home Medications    Prior to Admission medications   Medication Sig Start Date End Date Taking? Authorizing Provider  amLODipine (NORVASC) 5 MG tablet Take 1 tablet (5 mg total) by mouth daily. 06/15/18   Enid BaasKalisetti, Radhika, MD  cetirizine (ZYRTEC ALLERGY) 10 MG tablet Take 1 tablet (10 mg total) by mouth daily for 14 days. 01/04/22 01/18/22  Jone BasemanBock, Cheryl L, NP  hydrOXYzine (ATARAX/VISTARIL) 25 MG tablet Take 1 tablet (25 mg total) by mouth every 6 (six) hours. 07/27/20   Hall-Potvin, [place], PA-C  indapamide (LOZOL) 2.5 MG tablet TAKE 1 TABLET BY MOUTH EVERY DAY 11/25/18   McGowan, Carollee HerterShannon A, PA-C  meloxicam (MOBIC) 15 MG tablet Take 15 mg by mouth 3 (three) times daily. 12/10/21   [provider]  naproxen (NAPROSYN) 375 MG tablet Take 1 tablet (375 mg total) by mouth 2 (two) times daily. 10/17/21   Bing NeighborsHarris, Kimberly S, NP  oseltamivir (TAMIFLU) 75 MG capsule Take 1 capsule (75 mg total) by mouth every 12 (twelve) hours. 09/04/22   Immordino, Jeannett SeniorStephen, FNP    Family History Family History  Problem Relation Age of Onset   Kidney disease Maternal Aunt        dialysis   Lung cancer Father    Kidney disease Mother        one kidney   Kidney Stones Other    Prostate cancer Neg Hx     Social History Social History   Tobacco  Use   Smoking status: Former    Types: Cigars   Smokeless tobacco: Never  Vaping Use   Vaping Use: Never used  Substance Use Topics   Alcohol use: Not Currently    Comment: liquor and beer   Drug use: Not Currently    Types: Marijuana     Allergies   Patient has no known allergies.   Review of Systems Review of Systems  Constitutional:  Negative for chills and fever.  HENT:  Positive for congestion and sinus pressure. Negative for ear pain and sore throat.   Respiratory:  Positive for cough and shortness of breath.   Cardiovascular:  Negative for chest pain and palpitations.  Gastrointestinal:  Negative for nausea  and vomiting.  Neurological:  Positive for headaches. Negative for dizziness, facial asymmetry, speech difficulty, weakness and numbness.     Physical Exam Triage Vital Signs ED Triage Vitals [11/06/22 1115]  Enc Vitals Group     BP      Pulse      Resp      Temp      Temp src      SpO2      Weight      Height      Head Circumference      Peak Flow      Pain Score 7     Pain Loc      Pain Edu?      Excl. in [place]?    No data found.  Updated Vital Signs BP (!) 164/118   Pulse 86   Temp 97.9 F (36.6 C)   Resp 18   SpO2 97%   Visual Acuity Right Eye Distance:   Left Eye Distance:   Bilateral Distance:    Right Eye Near:   Left Eye Near:    Bilateral Near:     Physical Exam Vitals and nursing note reviewed.  Constitutional:      General: He is not in acute distress.    Appearance: Normal appearance. He is well-developed. He is not ill-appearing.  HENT:     Right Ear: Tympanic membrane normal.     Left Ear: Tympanic membrane normal.     Nose: Nose normal.     Mouth/Throat:     Mouth: Mucous membranes are moist.     Pharynx: Oropharynx is clear.  Eyes:     Pupils: Pupils are equal, round, and reactive to light.  Cardiovascular:     Rate and Rhythm: Normal rate and regular rhythm.     Heart sounds: Normal heart sounds.  Pulmonary:     Effort: Pulmonary effort is normal. No respiratory distress.     Breath sounds: Normal breath sounds.  Musculoskeletal:     Cervical back: Neck supple.  Skin:    General: Skin is warm and dry.  Neurological:     General: No focal deficit present.     Mental Status: He is alert and oriented to person, place, and time.     Sensory: No sensory deficit.     Motor: No weakness.     Gait: Gait normal.  Psychiatric:        Mood and Affect: Mood normal.        Behavior: Behavior normal.      UC Treatments / Results  Labs (all labs ordered are listed, but only abnormal results are displayed) Labs Reviewed - No data to  display  EKG   Radiology No results found.  Procedures Procedures (including  critical care time)  Medications Ordered in UC Medications - No data to display  Initial Impression / Assessment and Plan / UC Course  I have reviewed the triage vital signs and the nursing notes.  Pertinent labs & imaging results that were available during my care of the patient were reviewed by me and considered in my medical decision making (see chart for details).   Hypertensive urgency, elevated blood pressure reading with hypertension, acute headache, shortness of breath, productive cough.  Patient has history of hypertension and kidney disease.  He has not taken his blood pressure medication in several days.  He denies chest pain or focal weakness but does feel short of breath and reports a productive cough.  He reports a severe headache.  His blood pressure is elevated today at 177/121 and repeated at 164/118.  With his blood pressure being high, his current symptoms, and his history of renal disease, sending him to the ED for evaluation.  He is agreeable to this.  He declines EMS and states he is stable to drive himself to the ED.     Final Clinical Impressions(s) / UC Diagnoses   Final diagnoses:  Hypertensive urgency  Elevated blood pressure reading in office with diagnosis of hypertension  Acute nonintractable headache, unspecified headache type  Shortness of breath  Productive cough     Discharge Instructions      To the emergency department for evaluation of your very high blood pressure, severe headache, shortness of breath, productive cough, and other symptoms.  Blood pressure 177/121 ; repeat 164/118     ED Prescriptions   None    PDMP not reviewed this encounter.   Mickie Bail, NP 11/06/22 1139

## 2022-11-06 NOTE — Discharge Instructions (Addendum)
Buy a blood pressure cuff and check your blood pressure regularly over the next few weeks.  Keep a log of these readings so that you can show your doctor.  Continue taking your amlodipine as prescribed (including today). Take the antibiotic as prescribed and finish the full course.  You may also use the Flonase.  Take Tylenol (or ibuprofen) for the headache.  You may also use the Reglan (metoclopramide) that was prescribed for headache.  Avoid Sudafed (Pseudoephedrine). Make an appointment to follow-up with your primary care doctor. Return to the ER for new, worsening, or persistent severe headache, difficulty breathing, chest pain, weakness or lightheadedness, or persistent elevated blood pressure readings especially over 180-200 on the top number and 100-120 on the bottom number, or any other new or worsening symptoms that concern you.

## 2022-11-06 NOTE — ED Provider Notes (Signed)
[facility] Provider Note    Event Date/Time   First MD Initiated Contact with Patient 11/06/22 1213     (approximate)   History   Hypertension and Headache   HPI  Cameron Freeman is a 42 y.o. male with a history of hypertension and kidney stones who presents with elevated blood pressure noted at an urgent care visit today.  The patient states he is on amlodipine but has not taken it for the last several days.  The patient states that he has had rhinorrhea, nasal congestion, cough, and some shortness of breath over the last few days.  He has also had a bilateral headache intermittently for the last few days.  He denies any vomiting or diarrhea.  He has no chest pain.  He denies any fever or chills.  He went to urgent care for the URI symptoms and then was referred to the ED due to the blood pressure  I reviewed the past medical records.  I confirmed that the patient was seen in urgent care this morning and had blood pressures of 177/121 and 164/118.   Physical Exam   Triage Vital Signs: ED Triage Vitals  Enc Vitals Group     BP 11/06/22 1144 (!) 189/112     Pulse Rate 11/06/22 1146 79     Resp 11/06/22 1144 16     Temp 11/06/22 1144 98.2 F (36.8 C)     Temp Source 11/06/22 1144 Oral     SpO2 11/06/22 1144 100 %     Weight 11/06/22 1145 225 lb (102.1 kg)     Height 11/06/22 1145 5\' 9"  (1.753 m)     Head Circumference --      Peak Flow --      Pain Score 11/06/22 1144 7     Pain Loc --      Pain Edu? --      Excl. in GC? --     Most recent vital signs: Vitals:   11/06/22 1500 11/06/22 1652  BP: (!) 163/116 (!) 151/101  Pulse:    Resp:    Temp:    SpO2:       General: Awake, no distress.  CV:  Good peripheral perfusion.  Normal heart. Resp:  Normal effort.  Lungs CTAB. Abd:  No distention.  Other:  EOMI.  PERRLA.  No photophobia.  Motor intact in all extremities.  Neck supple, full range of motion.  No meningeal signs.   ED Results /  Procedures / Treatments   Labs (all labs ordered are listed, but only abnormal results are displayed) Labs Reviewed  BASIC METABOLIC PANEL - Abnormal; Notable for the following components:      Result Value   Creatinine, Ser 1.26 (*)    All other components within normal limits  CBC WITH DIFFERENTIAL/PLATELET - Abnormal; Notable for the following components:   WBC 17.5 (*)    Neutro Abs 13.7 (*)    Monocytes Absolute 1.1 (*)    All other components within normal limits  SARS CORONAVIRUS 2 BY RT PCR  TROPONIN I (HIGH SENSITIVITY)     EKG  ED ECG REPORT I, Dionne Bucy, the attending physician, personally viewed and interpreted this ECG.  Date: 11/06/2022 EKG Time: 1230 Rate: 81 Rhythm: normal sinus rhythm QRS Axis: normal Intervals: normal ST/T Wave abnormalities: Nonspecific T wave abnormality Narrative Interpretation: no evidence of acute ischemia; no prior EKG available for comparison    RADIOLOGY  Chest x-ray: I  independently viewed and interpreted the images; no focal consolidation or edema   PROCEDURES:  Critical Care performed: No  Procedures   MEDICATIONS ORDERED IN ED: Medications  cloNIDine (CATAPRES) tablet 0.2 mg (0.2 mg Oral Given 11/06/22 1328)  metoCLOPramide (REGLAN) injection 10 mg (10 mg Intramuscular Given 11/06/22 1521)  ibuprofen (ADVIL) tablet 600 mg (600 mg Oral Given 11/06/22 1522)     IMPRESSION / MDM / ASSESSMENT AND PLAN / ED COURSE  I reviewed the triage vital signs and the nursing notes.  42 year old male with PMH as noted above presents with cough, congestion, some shortness of breath, and headache as well as elevated blood pressure.  Differential diagnosis includes, but is not limited to, viral URI, acute bronchitis, COVID-19, less likely pneumonia.  Elevated blood pressure is likely due to him not taking his amlodipine for the last few days but we will obtain EKG, basic labs, and troponin to rule out hypertensive emergency  as well as give p.o. clonidine.  Patient's presentation is most consistent with acute presentation with potential threat to life or bodily function.  The patient is on the cardiac monitor to evaluate for evidence of arrhythmia and/or significant heart rate changes.  ----------------------------------------- 5:05 PM on 11/06/2022 -----------------------------------------  Troponin is negative.  There is no indication for a repeat based on the duration of the patient's symptoms.  COVID is negative.  CBC significant for leukocytosis which is consistent with response to infection in this relatively young patient.  Chest x-ray shows no evidence of pneumonia.  I suspect most likely acute sinusitis and likely some component of bronchitis.  Blood pressure has improved with clonidine.  The patient still had a headache but this is also improved now with Reglan and ibuprofen.  On reassessment, the patient is comfortable appearing and appears well.  There is no evidence of hypertensive emergency or any and organ dysfunction.  The patient is stable for discharge home.  I counseled him on the results of the workup and plan of care.  I recommend he continue his amlodipine and log his blood pressures.  I gave strict return precautions and the patient expresses understanding.   FINAL CLINICAL IMPRESSION(S) / ED DIAGNOSES   Final diagnoses:  Acute nonintractable headache, unspecified headache type  Hypertension, unspecified type  Acute non-recurrent sinusitis, unspecified location     Rx / DC Orders   ED Discharge Orders          Ordered    metoCLOPramide (REGLAN) 10 MG tablet  Every 8 hours PRN        11/06/22 1704    amoxicillin-clavulanate (AUGMENTIN) 875-125 MG tablet  2 times daily        11/06/22 1704    fluticasone (FLONASE) 50 MCG/ACT nasal spray  Daily        11/06/22 1704             Note:  This document was prepared using Dragon voice recognition software and may include  unintentional dictation errors.    Dionne Bucy, MD 11/06/22 [phone]

## 2023-03-04 ENCOUNTER — Encounter: Payer: Self-pay | Admitting: Nurse Practitioner

## 2023-03-04 ENCOUNTER — Ambulatory Visit (INDEPENDENT_AMBULATORY_CARE_PROVIDER_SITE_OTHER): Payer: 59 | Admitting: Nurse Practitioner

## 2023-03-04 ENCOUNTER — Other Ambulatory Visit: Payer: Self-pay

## 2023-03-04 VITALS — BP 132/88 | HR 72 | Temp 98.1°F | Resp 16 | Ht 69.0 in | Wt 215.7 lb

## 2023-03-04 DIAGNOSIS — Z7689 Persons encountering health services in other specified circumstances: Secondary | ICD-10-CM

## 2023-03-04 DIAGNOSIS — I10 Essential (primary) hypertension: Secondary | ICD-10-CM | POA: Diagnosis not present

## 2023-03-04 DIAGNOSIS — J309 Allergic rhinitis, unspecified: Secondary | ICD-10-CM | POA: Insufficient documentation

## 2023-03-04 DIAGNOSIS — Z131 Encounter for screening for diabetes mellitus: Secondary | ICD-10-CM | POA: Diagnosis not present

## 2023-03-04 DIAGNOSIS — Z114 Encounter for screening for human immunodeficiency virus [HIV]: Secondary | ICD-10-CM

## 2023-03-04 DIAGNOSIS — Z130 Encounter for screening for diseases of the blood and blood-forming organs and certain disorders involving the immune mechanism: Secondary | ICD-10-CM

## 2023-03-04 DIAGNOSIS — Z1159 Encounter for screening for other viral diseases: Secondary | ICD-10-CM

## 2023-03-04 DIAGNOSIS — Z13220 Encounter for screening for lipoid disorders: Secondary | ICD-10-CM

## 2023-03-04 DIAGNOSIS — J3089 Other allergic rhinitis: Secondary | ICD-10-CM

## 2023-03-04 DIAGNOSIS — Z6831 Body mass index (BMI) 31.0-31.9, adult: Secondary | ICD-10-CM

## 2023-03-04 DIAGNOSIS — L2084 Intrinsic (allergic) eczema: Secondary | ICD-10-CM

## 2023-03-04 DIAGNOSIS — E6609 Other obesity due to excess calories: Secondary | ICD-10-CM

## 2023-03-04 LAB — CBC WITH DIFFERENTIAL/PLATELET
Absolute Monocytes: 340 cells/uL (ref 200–950)
Basophils Absolute: 10 cells/uL (ref 0–200)
Basophils Relative: 0.2 %
Eosinophils Absolute: 290 cells/uL (ref 15–500)
Eosinophils Relative: 5.8 %
HCT: 46.8 % (ref 38.5–50.0)
Hemoglobin: 16 g/dL (ref 13.2–17.1)
Lymphs Abs: 2050 cells/uL (ref 850–3900)
MCH: 30.4 pg (ref 27.0–33.0)
MCHC: 34.2 g/dL (ref 32.0–36.0)
MCV: 88.8 fL (ref 80.0–100.0)
MPV: 10 fL (ref 7.5–12.5)
Monocytes Relative: 6.8 %
Neutro Abs: 2310 cells/uL (ref 1500–7800)
Neutrophils Relative %: 46.2 %
Platelets: 204 10*3/uL (ref 140–400)
RBC: 5.27 10*6/uL (ref 4.20–5.80)
RDW: 12.5 % (ref 11.0–15.0)
Total Lymphocyte: 41 %
WBC: 5 10*3/uL (ref 3.8–10.8)

## 2023-03-04 MED ORDER — AMLODIPINE BESYLATE 5 MG PO TABS
5.0000 mg | ORAL_TABLET | Freq: Every day | ORAL | 1 refills | Status: DC
Start: 2023-03-04 — End: 2023-09-15

## 2023-03-04 MED ORDER — FLUTICASONE PROPIONATE 50 MCG/ACT NA SUSP: 1.0000 | Freq: Every day | NASAL | 5 refills | Status: DC

## 2023-03-04 MED ORDER — TRIAMCINOLONE ACETONIDE 0.5 % EX OINT
1.0000 | TOPICAL_OINTMENT | Freq: Two times a day (BID) | CUTANEOUS | 0 refills | Status: AC
Start: 2023-03-04 — End: ?

## 2023-03-04 MED ORDER — CETIRIZINE HCL 10 MG PO TABS
10.0000 mg | ORAL_TABLET | Freq: Every day | ORAL | 1 refills | Status: AC
Start: 2023-03-04 — End: ?

## 2023-03-04 NOTE — Assessment & Plan Note (Signed)
Continue flonase and zyrtec

## 2023-03-04 NOTE — Assessment & Plan Note (Signed)
Continue amlodipine.  Try and reduce sodium in diet.  Information provided for DASH diet.

## 2023-03-04 NOTE — Assessment & Plan Note (Signed)
Continue zyrtec, use moisturizer on areas frequently especially after getting out of water.  Use kenalog cream sparingly as needed for itching.

## 2023-03-04 NOTE — Progress Notes (Signed)
BP 132/88   Pulse 72   Temp 98.1 F (36.7 C) (Oral)   Resp 16   Ht 5\' 9"  (1.753 m)   Wt 215 lb 11.2 oz (97.8 kg)   SpO2 98%   BMI 31.85 kg/m    Subjective:    Patient ID: Cameron Freeman, male    DOB: October 14, 1980, 42 y.o.   MRN: [id number]  HPI: Cameron Freeman is a 42 y.o. male  Chief Complaint  Patient presents with   Establish Care   Hypertension   Establish care: his last physical was last year around June.  Medical history includes HTN.  Family history includes HTN, DM, lung cancer.  Health Maintenance due for labs.   Hypertension:  -Medications: amlodipine 5 mg daily, he does not like taking medication, he would like to work on diet so he can come off medication -Patient is compliant with above medications and reports no side effects. -Checking BP at home (average): 130s/80s -Denies any SOB, CP, vision changes, LE edema or symptoms of hypotension -Diet: recommend DASH diet, he has been working on lifestyle modifications, he does eat a well balanced diet.  -Exercise: recommend 150 min of physical activity weekly , he is going to work on increasing physical activity      03/04/2023   10:01 AM 11/06/2022    4:52 PM 11/06/2022    3:00 PM  Vitals with BMI  Height 5\' 9"     Weight 215 lbs 11 oz    BMI 31.84    Systolic 132 151 952  Diastolic 88 101 116  Pulse 72      Obesity:  Current weight : 215 lbs BMI: 31.85 Treatment Tried: lifestyle modification Comorbidities: htn   Allergic rhinits/atopical dermatitis: continue zyrtec and flonase,  use moisturizer examples ceraVe, aquaphor or eucerine.  Apply everytime you get out of water.  Use kenalog cream as needed for itching.   Relevant past medical, surgical, family and social history reviewed and updated as indicated. Interim medical history since our last visit reviewed. Allergies and medications reviewed and updated.  Review of Systems  Constitutional: Negative for fever or weight change.  Respiratory:  Negative for cough and shortness of breath.   Cardiovascular: Negative for chest pain or palpitations.  Gastrointestinal: Negative for abdominal pain, no bowel changes.  Musculoskeletal: Negative for gait problem or joint swelling.  Skin: positive for rash.  Neurological: Negative for dizziness or headache.  No other specific complaints in a complete review of systems (except as listed in HPI above).      Objective:    BP 132/88   Pulse 72   Temp 98.1 F (36.7 C) (Oral)   Resp 16   Ht 5\' 9"  (1.753 m)   Wt 215 lb 11.2 oz (97.8 kg)   SpO2 98%   BMI 31.85 kg/m   Wt Readings from Last 3 Encounters:  03/04/23 215 lb 11.2 oz (97.8 kg)  11/06/22 225 lb (102.1 kg)  06/28/22 218 lb (98.9 kg)    Physical Exam  Constitutional: Patient appears well-developed and well-nourished. Obese well proportioned, muscular No distress.  HEENT: head atraumatic, normocephalic, pupils equal and reactive to light, neck supple, throat within normal limits Cardiovascular: Normal rate, regular rhythm and normal heart sounds.  No murmur heard. No BLE edema. Pulmonary/Chest: Effort normal and breath sounds normal. No respiratory distress. Abdominal: Soft.  There is no tenderness. Skin : atopical dermatitis to palm of hand, fingers, antecubital area Psychiatric: Patient has a normal mood  and affect. behavior is normal. Judgment and thought content normal.  Results for orders placed or performed during the hospital encounter of 11/06/22  SARS Coronavirus 2 by RT PCR (hospital order, performed in [facility] hospital lab) *cepheid single result test* Anterior Nasal Swab   Specimen: Anterior Nasal Swab  Result Value Ref Range   SARS Coronavirus 2 by RT PCR NEGATIVE NEGATIVE  Basic metabolic panel  Result Value Ref Range   Sodium 139 135 - 145 mmol/L   Potassium 3.7 3.5 - 5.1 mmol/L   Chloride 105 98 - 111 mmol/L   CO2 24 22 - 32 mmol/L   Glucose, Bld 89 70 - 99 mg/dL   BUN 14 6 - 20 mg/dL   Creatinine,  Ser 9.60 (H) 0.61 - 1.24 mg/dL   Calcium 9.0 8.9 - 45.4 mg/dL   GFR, Estimated >09 >81 mL/min   Anion gap 10 5 - 15  CBC with Differential  Result Value Ref Range   WBC 17.5 (H) 4.0 - 10.5 K/uL   RBC 5.42 4.22 - 5.81 MIL/uL   Hemoglobin 16.1 13.0 - 17.0 g/dL   HCT 19.1 47.8 - 29.5 %   MCV 90.2 80.0 - 100.0 fL   MCH 29.7 26.0 - 34.0 pg   MCHC 32.9 30.0 - 36.0 g/dL   RDW 62.1 30.8 - 65.7 %   Platelets 178 150 - 400 K/uL   nRBC 0.0 0.0 - 0.2 %   Neutrophils Relative % 79 %   Neutro Abs 13.7 (H) 1.7 - 7.7 K/uL   Lymphocytes Relative 14 %   Lymphs Abs 2.4 0.7 - 4.0 K/uL   Monocytes Relative 6 %   Monocytes Absolute 1.1 (H) 0.1 - 1.0 K/uL   Eosinophils Relative 1 %   Eosinophils Absolute 0.2 0.0 - 0.5 K/uL   Basophils Relative 0 %   Basophils Absolute 0.0 0.0 - 0.1 K/uL   Immature Granulocytes 0 %   Abs Immature Granulocytes 0.07 0.00 - 0.07 K/uL  Troponin I (High Sensitivity)  Result Value Ref Range   Troponin I (High Sensitivity) 7 <18 ng/L      Assessment & Plan:   Problem List Items Addressed This Visit       Cardiovascular and Mediastinum   Primary hypertension - Primary    Continue amlodipine.  Try and reduce sodium in diet.  Information provided for DASH diet.       Relevant Medications   amLODipine (NORVASC) 5 MG tablet   Other Relevant Orders   CBC with Differential/Platelet   COMPLETE METABOLIC PANEL WITH GFR     Respiratory   Allergic rhinitis    Continue flonase and zyrtec      Relevant Medications   fluticasone (FLONASE) 50 MCG/ACT nasal spray     Musculoskeletal and Integument   Intrinsic eczema    Continue zyrtec, use moisturizer on areas frequently especially after getting out of water.  Use kenalog cream sparingly as needed for itching.       Relevant Medications   triamcinolone ointment (KENALOG) 0.5 %   cetirizine (ZYRTEC ALLERGY) 10 MG tablet     Other   Class 1 obesity due to excess calories with serious comorbidity and body mass index  (BMI) of 31.0 to 31.9 in adult    Increase physical activity, eat well balanced diet with portion control       Other Visit Diagnoses     Encounter to establish care       Screening for diabetes  mellitus       Relevant Orders   COMPLETE METABOLIC PANEL WITH GFR   Hemoglobin A1c   Screening for cholesterol level       Relevant Orders   Lipid panel   Screening for deficiency anemia       Relevant Orders   CBC with Differential/Platelet   Screening for HIV without presence of risk factors       Relevant Orders   HIV Antibody (routine testing w rflx)   Encounter for hepatitis C screening test for low risk patient       Relevant Orders   Hepatitis C antibody        Follow up plan: Return in about 6 months (around 09/04/2023) for follow up.

## 2023-03-04 NOTE — Assessment & Plan Note (Signed)
Increase physical activity, eat well balanced diet with portion control

## 2023-07-05 ENCOUNTER — Encounter: Payer: Self-pay | Admitting: *Deleted

## 2023-07-05 ENCOUNTER — Ambulatory Visit
Admission: EM | Admit: 2023-07-05 | Discharge: 2023-07-05 | Disposition: A | Discharge status: Home / Self Care | Payer: 59 | Attending: Emergency Medicine | Admitting: Emergency Medicine

## 2023-07-05 DIAGNOSIS — J069 Acute upper respiratory infection, unspecified: Secondary | ICD-10-CM

## 2023-07-05 DIAGNOSIS — I10 Essential (primary) hypertension: Secondary | ICD-10-CM | POA: Diagnosis not present

## 2023-07-05 MED ORDER — PROMETHAZINE-DM 6.25-15 MG/5ML PO SYRP: 5.0000 mL | ORAL_SOLUTION | Freq: Every evening | ORAL | 0 refills | Status: DC | PRN

## 2023-07-05 MED ORDER — ONDANSETRON 4 MG PO TBDP: 4.0000 mg | ORAL_TABLET | Freq: Three times a day (TID) | ORAL | 0 refills | Status: DC | PRN

## 2023-07-05 MED ORDER — AMOXICILLIN-POT CLAVULANATE 875-125 MG PO TABS: 1.0000 | ORAL_TABLET | Freq: Two times a day (BID) | ORAL | 0 refills | Status: DC

## 2023-07-05 MED ORDER — PREDNISONE 10 MG (21) PO TBPK: ORAL_TABLET | Freq: Every day | ORAL | 0 refills | Status: DC

## 2023-07-05 MED ORDER — BENZONATATE 100 MG PO CAPS: 100.0000 mg | ORAL_CAPSULE | Freq: Three times a day (TID) | ORAL | 0 refills | Status: DC

## 2023-07-05 NOTE — Discharge Instructions (Addendum)
Your symptoms today are most likely being caused by a virus and should steadily improve in time it can take up to 7 to 10 days before you truly start to see a turnaround however things will get better until improvement seen by Friday, December 13 he may begin use of Augmentin for bacterial coverage  In the meantime begin prednisone every morning with small amount of food to open and relax the airway, should settle shortness of breath and wheezing  You may use Tessalon pill every 8 hours as needed to help calm coughing, may use cough syrup at bedtime for additional comfort and to allow for rest  You may use Zofran every 8 hours as needed for nausea Place under tongue and allow to dissolve  Please begin taking your blood pressure medication upon returning home, take as directed, monitor blood pressure to ensure that it is decreasing, ensure that you are taking over-the-counter medication for those with high blood pressure such as Coricidin, follow-up with your primary doctor for further concerns    You can take Tylenol and/or Ibuprofen as needed for fever reduction and pain relief.   For cough: honey 1/2 to 1 teaspoon (you can dilute the honey in water or another fluid).  You can also use guaifenesin and dextromethorphan for cough. You can use a humidifier for chest congestion and cough.  If you don't have a humidifier, you can sit in the bathroom with the hot shower running.      For sore throat: try warm salt water gargles, cepacol lozenges, throat spray, warm tea or water with lemon/honey, popsicles or ice, or OTC cold relief medicine for throat discomfort.   For congestion: take a daily anti-histamine like Zyrtec, Claritin, and a oral decongestant, such as pseudoephedrine.  You can also use Flonase 1-2 sprays in each nostril daily.   It is important to stay hydrated: drink plenty of fluids (water, gatorade/powerade/pedialyte, juices, or teas) to keep your throat moisturized and help further  relieve irritation/discomfort.

## 2023-07-05 NOTE — ED Provider Notes (Signed)
[name]    CSN: [id number] Arrival date & time: 07/05/23  1031      History   Chief Complaint Chief Complaint  Patient presents with   Nasal Congestion   Cough   Headache    HPI Cameron Freeman is a 42 y.o. male.   Patient presents for evaluation of chills, body aches, nasal congestion, rhinorrhea, productive cough, intermittent generalized headaches, shortness of breath with exertion, wheezing at rest, sore throat, nausea without vomiting and left-sided ear popping for 3 days.  Ear popping only occurring for 1 day, has resolved.  Sore throat has improved.  Possible sick contacts at work.  Denies presence of fever.  Poor appetite due to nausea.  Has attempted use of Sudafed, Robitussin and TheraFlu.  Past Medical History:  Diagnosis Date   Hypertension    Kidney stones    Microscopic hematuria     Patient Active Problem List   Diagnosis Date Noted   Primary hypertension 03/04/2023   Class 1 obesity due to excess calories with serious comorbidity and body mass index (BMI) of 31.0 to 31.9 in adult 03/04/2023   Allergic rhinitis 03/04/2023   Intrinsic eczema 03/04/2023   Hydronephrosis 06/13/2018   Left ureteral stone 09/13/2015   Hydronephrosis with urinary obstruction due to ureteral calculus 09/13/2015   History of renal stone 09/01/2015   Microscopic hematuria 09/01/2015    Past Surgical History:  Procedure Laterality Date   CYSTOSCOPY WITH STENT PLACEMENT Right 06/14/2018   Procedure: CYSTOSCOPY WITH STENT PLACEMENT;  Surgeon: Vanna Scotland, MD;  Location: [facility];  Service: Urology;  Laterality: Right;   EXTRACORPOREAL SHOCK WAVE LITHOTRIPSY Left 09/20/2015   Procedure: EXTRACORPOREAL SHOCK WAVE LITHOTRIPSY (ESWL);  Surgeon: Hildred Laser, MD;  Location: [facility];  Service: Urology;  Laterality: Left;   NO PAST SURGERIES     none     URETEROSCOPY WITH HOLMIUM LASER LITHOTRIPSY Right 06/14/2018   Procedure: URETEROSCOPY WITH HOLMIUM LASER  LITHOTRIPSY;  Surgeon: Vanna Scotland, MD;  Location: [facility];  Service: Urology;  Laterality: Right;   WRIST SURGERY  03/06/2022       Home Medications    Prior to Admission medications   Medication Sig Start Date End Date Taking? Authorizing Provider  amoxicillin-clavulanate (AUGMENTIN) 875-125 MG tablet Take 1 tablet by mouth every 12 (twelve) hours. 07/10/23  Yes Delroy Ordway R, NP  benzonatate (TESSALON) 100 MG capsule Take 1 capsule (100 mg total) by mouth every 8 (eight) hours. 07/05/23  Yes Aasim Restivo R, NP  ondansetron (ZOFRAN-ODT) 4 MG disintegrating tablet Take 1 tablet (4 mg total) by mouth every 8 (eight) hours as needed for nausea or vomiting. 07/05/23  Yes Renesmae Donahey R, NP  predniSONE (STERAPRED UNI-PAK 21 TAB) 10 MG (21) TBPK tablet Take by mouth daily. Take 6 tabs by mouth daily  for 1 days, then 5 tabs for 1 days, then 4 tabs for 1 days, then 3 tabs for 1 days, 2 tabs for 1 days, then 1 tab by mouth daily for 1 days 07/05/23  Yes Abdullah Rizzi R, NP  promethazine-dextromethorphan (PROMETHAZINE-DM) 6.25-15 MG/5ML syrup Take 5 mLs by mouth at bedtime as needed for cough. 07/05/23  Yes Ralston Venus R, NP  amLODipine (NORVASC) 5 MG tablet Take 1 tablet (5 mg total) by mouth daily. 03/04/23   Berniece Salines, FNP  cetirizine (ZYRTEC ALLERGY) 10 MG tablet Take 1 tablet (10 mg total) by mouth daily. 03/04/23   Berniece Salines, FNP  fluticasone (  FLONASE) 50 MCG/ACT nasal spray Place 1 spray into both nostrils daily for 14 days. 03/04/23 03/18/23  Berniece Salines, FNP  triamcinolone ointment (KENALOG) 0.5 % Apply 1 Application topically 2 (two) times daily. 03/04/23   Berniece Salines, FNP    Family History Family History  Problem Relation Age of Onset   Kidney disease Maternal Aunt        dialysis   Lung cancer Father    Kidney disease Mother        one kidney   Kidney Stones Other    Prostate cancer Neg Hx     Social History Social History   Tobacco Use    Smoking status: Former    Types: Cigars    Passive exposure: Never   Smokeless tobacco: Never  Vaping Use   Vaping status: Never Used  Substance Use Topics   Alcohol use: Not Currently    Comment: liquor and beer   Drug use: Not Currently    Types: Marijuana     Allergies   Patient has no known allergies.   Review of Systems Review of Systems   Physical Exam Triage Vital Signs ED Triage Vitals  Encounter Vitals Group     BP 07/05/23 1144 (!) 167/106     Systolic BP Percentile --      Diastolic BP Percentile --      Pulse Rate 07/05/23 1144 95     Resp 07/05/23 1144 18     Temp 07/05/23 1144 99.3 F (37.4 C)     Temp Source 07/05/23 1144 Oral     SpO2 07/05/23 1144 96 %     Weight 07/05/23 1142 218 lb (98.9 kg)     Height 07/05/23 1142 5\' 9"  (1.753 m)     Head Circumference --      Peak Flow --      Pain Score 07/05/23 1141 5     Pain Loc --      Pain Education --      Exclude from Growth Chart --    No data found.  Updated Vital Signs BP (!) 167/106 (BP Location: Left Arm) Comment: checked x2, hasn't taken BP med in a few days, provider notified  Pulse 95   Temp 99.3 F (37.4 C) (Oral)   Resp 18   Ht 5\' 9"  (1.753 m)   Wt 218 lb (98.9 kg)   SpO2 96%   BMI 32.19 kg/m   Visual Acuity Right Eye Distance:   Left Eye Distance:   Bilateral Distance:    Right Eye Near:   Left Eye Near:    Bilateral Near:     Physical Exam Constitutional:      Appearance: Normal appearance. He is well-developed.  HENT:     Head: Normocephalic.     Right Ear: Tympanic membrane, ear canal and external ear normal.     Left Ear: Tympanic membrane, ear canal and external ear normal.     Nose: Congestion present. No rhinorrhea.     Mouth/Throat:     Pharynx: No oropharyngeal exudate or posterior oropharyngeal erythema.  Eyes:     Extraocular Movements: Extraocular movements intact.  Cardiovascular:     Rate and Rhythm: Normal rate and regular rhythm.     Pulses:  Normal pulses.     Heart sounds: Normal heart sounds.  Pulmonary:     Effort: Pulmonary effort is normal.     Breath sounds: Normal breath sounds.  Musculoskeletal:  Cervical back: Normal range of motion and neck supple.  Neurological:     Mental Status: He is alert and oriented to person, place, and time. Mental status is at baseline.      UC Treatments / Results  Labs (all labs ordered are listed, but only abnormal results are displayed) Labs Reviewed - No data to display  EKG   Radiology No results found.  Procedures Procedures (including critical care time)  Medications Ordered in UC Medications - No data to display  Initial Impression / Assessment and Plan / UC Course  I have reviewed the triage vital signs and the nursing notes.  Pertinent labs & imaging results that were available during my care of the patient were reviewed by me and considered in my medical decision making (see chart for details).  Viral URI with cough, elevated blood pressure reading in office with diagnosis of hypertension  Patient is in no signs of distress nor toxic appearing.  Vital signs are stable.  Low suspicion for pneumonia, pneumothorax or bronchitis and therefore will defer imaging.  Prescribed prednisone, Tessalon, Promethazine DM and Zofran, watch wait antibiotic placed at pharmacy if no improvement seen by day 10 of illness, Augmentin prescribed.May use additional over-the-counter medications as needed for supportive care.  May follow-up with urgent care as needed if symptoms persist or worsen.Note given.    Blood pressure 167/106 in clinic, no signs of hypertensive urgency, stable for outpatient management, patient endorses he has not been taking medicine since illness began due to nausea, strongly advised him to restart medicine today and to monitor blood pressure to ensure that it is decreasing with follow-up with PCP as needed Final Clinical Impressions(s) / UC Diagnoses   Final  diagnoses:  Elevated blood pressure reading in office with diagnosis of hypertension  Viral URI with cough     Discharge Instructions      Your symptoms today are most likely being caused by a virus and should steadily improve in time it can take up to 7 to 10 days before you truly start to see a turnaround however things will get better until improvement seen by Friday, December 13 he may begin use of Augmentin for bacterial coverage  In the meantime begin prednisone every morning with small amount of food to open and relax the airway, should settle shortness of breath and wheezing  You may use Tessalon pill every 8 hours as needed to help calm coughing, may use cough syrup at bedtime for additional comfort and to allow for rest  You may use Zofran every 8 hours as needed for nausea Place under tongue and allow to dissolve  Please begin taking your blood pressure medication upon returning home, take as directed, monitor blood pressure to ensure that it is decreasing, ensure that you are taking over-the-counter medication for those with high blood pressure such as Coricidin, follow-up with your primary doctor for further concerns    You can take Tylenol and/or Ibuprofen as needed for fever reduction and pain relief.   For cough: honey 1/2 to 1 teaspoon (you can dilute the honey in water or another fluid).  You can also use guaifenesin and dextromethorphan for cough. You can use a humidifier for chest congestion and cough.  If you don't have a humidifier, you can sit in the bathroom with the hot shower running.      For sore throat: try warm salt water gargles, cepacol lozenges, throat spray, warm tea or water with lemon/honey, popsicles or ice,  or OTC cold relief medicine for throat discomfort.   For congestion: take a daily anti-histamine like Zyrtec, Claritin, and a oral decongestant, such as pseudoephedrine.  You can also use Flonase 1-2 sprays in each nostril daily.   It is important  to stay hydrated: drink plenty of fluids (water, gatorade/powerade/pedialyte, juices, or teas) to keep your throat moisturized and help further relieve irritation/discomfort.    ED Prescriptions     Medication Sig Dispense Auth. Provider   predniSONE (STERAPRED UNI-PAK 21 TAB) 10 MG (21) TBPK tablet Take by mouth daily. Take 6 tabs by mouth daily  for 1 days, then 5 tabs for 1 days, then 4 tabs for 1 days, then 3 tabs for 1 days, 2 tabs for 1 days, then 1 tab by mouth daily for 1 days 21 tablet Delwyn Scoggin R, NP   benzonatate (TESSALON) 100 MG capsule Take 1 capsule (100 mg total) by mouth every 8 (eight) hours. 21 capsule Ilene Witcher R, NP   promethazine-dextromethorphan (PROMETHAZINE-DM) 6.25-15 MG/5ML syrup Take 5 mLs by mouth at bedtime as needed for cough. 118 mL Brittony Billick R, NP   amoxicillin-clavulanate (AUGMENTIN) 875-125 MG tablet Take 1 tablet by mouth every 12 (twelve) hours. 14 tablet Okie Bogacz R, NP   ondansetron (ZOFRAN-ODT) 4 MG disintegrating tablet Take 1 tablet (4 mg total) by mouth every 8 (eight) hours as needed for nausea or vomiting. 20 tablet Valinda Hoar, NP      PDMP not reviewed this encounter.   Valinda Hoar, NP 07/05/23 1236

## 2023-07-05 NOTE — ED Triage Notes (Signed)
Patient states cough/congestion, headache since Thursday.  Taking OTC Sudafed, Theraflu with little relief

## 2023-09-04 ENCOUNTER — Ambulatory Visit (INDEPENDENT_AMBULATORY_CARE_PROVIDER_SITE_OTHER): Payer: 59 | Admitting: Nurse Practitioner

## 2023-09-04 ENCOUNTER — Encounter: Payer: Self-pay | Admitting: Nurse Practitioner

## 2023-09-04 VITALS — BP 134/84 | HR 73 | Temp 97.9°F | Resp 18 | Ht 69.0 in | Wt 215.8 lb

## 2023-09-04 DIAGNOSIS — E785 Hyperlipidemia, unspecified: Secondary | ICD-10-CM | POA: Insufficient documentation

## 2023-09-04 DIAGNOSIS — E66811 Obesity, class 1: Secondary | ICD-10-CM | POA: Diagnosis not present

## 2023-09-04 DIAGNOSIS — Z131 Encounter for screening for diabetes mellitus: Secondary | ICD-10-CM

## 2023-09-04 DIAGNOSIS — E6609 Other obesity due to excess calories: Secondary | ICD-10-CM

## 2023-09-04 DIAGNOSIS — I10 Essential (primary) hypertension: Secondary | ICD-10-CM

## 2023-09-04 DIAGNOSIS — Z6831 Body mass index (BMI) 31.0-31.9, adult: Secondary | ICD-10-CM

## 2023-09-04 NOTE — Progress Notes (Signed)
 BP 134/84   Pulse 73   Temp 97.9 F (36.6 C)   Resp 18   Ht 5' 9 (1.753 m)   Wt 215 lb 12.8 oz (97.9 kg)   SpO2 98%   BMI 31.87 kg/m    Subjective:    Patient ID: Cameron Freeman, male    DOB: [birth year], 43 y.o.   MRN: [id number]  HPI: Cameron Freeman is a 43 y.o. male  Chief Complaint  Patient presents with   Medical Management of Chronic Issues    Discussed the use of AI scribe software for clinical note transcription with the patient, who gave verbal consent to proceed.  History of Present Illness   The patient, with a history of hypertension, obesity, and hyperlipidemia, presents for a follow-up visit. He is currently taking amlodipine  5mg  daily. The patient's blood pressure today is 142/80, and his weight is 215 pounds with a BMI of 31.87. He reports feeling well overall, with no complaints of headaches, chest pain, or swelling in the legs. He has been working on dietary changes, focusing on more fruits and vegetables, but finds it challenging due to the prevalence of sodium in many foods. The patient is open to increasing his amlodipine  dosage but would prefer to continue working on lifestyle modifications for another six months before resorting to medication changes. He has a home blood pressure monitor and will keep an eye on his readings. The patient's last LDL was 113 on 03/04/2023. He has no family history of cholesterol problems but does have a family history of high blood pressure and diabetes.       09/04/2023    9:33 AM 03/04/2023   10:03 AM  Depression screen PHQ 2/9  Decreased Interest 0 0  Down, Depressed, Hopeless 0 0  PHQ - 2 Score 0 0  Altered sleeping 0   Tired, decreased energy 0   Change in appetite 0   Feeling bad or failure about yourself  0   Trouble concentrating 0   Moving slowly or fidgety/restless 0   Suicidal thoughts 0   PHQ-9 Score 0   Difficult doing work/chores Not difficult at all     Relevant past medical, surgical, family and social  history reviewed and updated as indicated. Interim medical history since our last visit reviewed. Allergies and medications reviewed and updated.  Review of Systems  Constitutional: Negative for fever or weight change.  Respiratory: Negative for cough and shortness of breath.   Cardiovascular: Negative for chest pain or palpitations.  Gastrointestinal: Negative for abdominal pain, no bowel changes.  Musculoskeletal: Negative for gait problem or joint swelling.  Skin: Negative for rash.  Neurological: Negative for dizziness or headache.  No other specific complaints in a complete review of systems (except as listed in HPI above).      Objective:    BP 134/84   Pulse 73   Temp 97.9 F (36.6 C)   Resp 18   Ht 5' 9 (1.753 m)   Wt 215 lb 12.8 oz (97.9 kg)   SpO2 98%   BMI 31.87 kg/m   BP Readings from Last 3 Encounters:  09/04/23 134/84  07/05/23 (!) 167/106  03/04/23 132/88     Wt Readings from Last 3 Encounters:  09/04/23 215 lb 12.8 oz (97.9 kg)  07/05/23 218 lb (98.9 kg)  03/04/23 215 lb 11.2 oz (97.8 kg)    Physical Exam Vitals reviewed.  Constitutional:      Appearance: Normal appearance.  HENT:  Head: Normocephalic.  Cardiovascular:     Rate and Rhythm: Normal rate and regular rhythm.  Pulmonary:     Effort: Pulmonary effort is normal.     Breath sounds: Normal breath sounds.  Musculoskeletal:        General: Normal range of motion.  Skin:    General: Skin is warm and dry.  Neurological:     General: No focal deficit present.     Mental Status: He is alert and oriented to person, place, and time. Mental status is at baseline.  Psychiatric:        Mood and Affect: Mood normal.        Behavior: Behavior normal.        Thought Content: Thought content normal.        Judgment: Judgment normal.      Last CBC Lab Results  Component Value Date   WBC 5.0 03/04/2023   HGB 16.0 03/04/2023   HCT 46.8 03/04/2023   MCV 88.8 03/04/2023   MCH 30.4  03/04/2023   RDW 12.5 03/04/2023   PLT 204 03/04/2023   Last metabolic panel Lab Results  Component Value Date   GLUCOSE 95 03/04/2023   NA 137 03/04/2023   K 4.3 03/04/2023   CL 105 03/04/2023   CO2 25 03/04/2023   BUN 17 03/04/2023   CREATININE 1.28 03/04/2023   EGFR 73 03/04/2023   CALCIUM 9.1 03/04/2023   PROT 6.7 03/04/2023   BILITOT 0.4 03/04/2023   AST 18 03/04/2023   ALT 16 03/04/2023   ANIONGAP 10 11/06/2022   Last lipids Lab Results  Component Value Date   CHOL 184 03/04/2023   HDL 55 03/04/2023   LDLCALC 113 (H) 03/04/2023   TRIG 74 03/04/2023   CHOLHDL 3.3 03/04/2023   Last hemoglobin A1c Lab Results  Component Value Date   HGBA1C 5.6 03/04/2023        Assessment & Plan:   Problem List Items Addressed This Visit       Cardiovascular and Mediastinum   Primary hypertension - Primary   Relevant Orders   CBC with Differential/Platelet   COMPLETE METABOLIC PANEL WITH GFR     Other   Class 1 obesity due to excess calories with serious comorbidity and body mass index (BMI) of 31.0 to 31.9 in adult   Hyperlipidemia   Relevant Orders   Lipid panel   Lipoprotein A (LPA)   Other Visit Diagnoses       Screening for diabetes mellitus       Relevant Orders   COMPLETE METABOLIC PANEL WITH GFR   Hemoglobin A1c        Assessment and Plan    Hypertension Elevated blood pressure today (142/80). Patient is currently on Amlodipine  5mg  daily. Discussed the importance of diet and lifestyle modifications, as well as the potential need to increase medication dosage. Patient prefers to continue with lifestyle modifications for now. -Continue Amlodipine  5mg  daily. -Check blood pressure at home and return sooner than 6 months if readings are consistently elevated. -discussed risks associated with uncontrolled blood pressure  Hyperlipidemia Last LDL was 113 on 8/70/2024. Discussed the importance of diet and lifestyle modifications. Patient agrees to continue  with lifestyle modifications. -Draw lipid panel and lipoprotein A today to assess cardiovascular risk.  Obesity Current BMI 31.87. Discussed the importance of diet and lifestyle modifications. Patient is making efforts to improve diet. -Continue with diet and lifestyle modifications.  General Health Maintenance -declined flu vaccine -Follow-up in 6 months  or sooner if blood pressure readings are consistently elevated at home.        Follow up plan: Return in about 6 months (around 03/03/2024) for follow up.

## 2023-09-07 ENCOUNTER — Encounter: Payer: Self-pay | Admitting: Nurse Practitioner

## 2023-09-10 LAB — CBC WITH DIFFERENTIAL/PLATELET
Absolute Lymphocytes: 1800 {cells}/uL (ref 850–3900)
Absolute Monocytes: 438 {cells}/uL (ref 200–950)
Basophils Absolute: 12 {cells}/uL (ref 0–200)
Basophils Relative: 0.2 %
Eosinophils Absolute: 258 {cells}/uL (ref 15–500)
Eosinophils Relative: 4.3 %
HCT: 44 % (ref 38.5–50.0)
Hemoglobin: 14.6 g/dL (ref 13.2–17.1)
MCH: 29.9 pg (ref 27.0–33.0)
MCHC: 33.2 g/dL (ref 32.0–36.0)
MCV: 90.2 fL (ref 80.0–100.0)
MPV: 10.1 fL (ref 7.5–12.5)
Monocytes Relative: 7.3 %
Neutro Abs: 3492 {cells}/uL (ref 1500–7800)
Neutrophils Relative %: 58.2 %
Platelets: 221 10*3/uL (ref 140–400)
RBC: 4.88 10*6/uL (ref 4.20–5.80)
RDW: 12 % (ref 11.0–15.0)
Total Lymphocyte: 30 %
WBC: 6 10*3/uL (ref 3.8–10.8)

## 2023-09-10 LAB — COMPLETE METABOLIC PANEL WITH GFR
AG Ratio: 1.6 (calc) (ref 1.0–2.5)
ALT: 17 U/L (ref 9–46)
AST: 18 U/L (ref 10–40)
Albumin: 4.1 g/dL (ref 3.6–5.1)
Alkaline phosphatase (APISO): 63 U/L (ref 36–130)
BUN/Creatinine Ratio: 13 (calc) (ref 6–22)
BUN: 18 mg/dL (ref 7–25)
CO2: 26 mmol/L (ref 20–32)
Calcium: 9.1 mg/dL (ref 8.6–10.3)
Chloride: 108 mmol/L (ref 98–110)
Creat: 1.35 mg/dL — ABNORMAL HIGH (ref 0.60–1.29)
Globulin: 2.6 g/dL (ref 1.9–3.7)
Glucose, Bld: 121 mg/dL — ABNORMAL HIGH (ref 65–99)
Potassium: 4.6 mmol/L (ref 3.5–5.3)
Sodium: 140 mmol/L (ref 135–146)
Total Bilirubin: 0.3 mg/dL (ref 0.2–1.2)
Total Protein: 6.7 g/dL (ref 6.1–8.1)
eGFR: 68 mL/min/{1.73_m2} (ref >=60)

## 2023-09-10 LAB — HEMOGLOBIN A1C
Hgb A1c MFr Bld: 5.9 %{Hb} — ABNORMAL HIGH (ref <5.7)
Mean Plasma Glucose: 123 mg/dL
eAG (mmol/L): 6.8 mmol/L

## 2023-09-10 LAB — LIPID PANEL
Cholesterol: 166 mg/dL (ref <200)
HDL: 60 mg/dL (ref >=40)
LDL Cholesterol (Calc): 91 mg/dL
Non-HDL Cholesterol (Calc): 106 mg/dL (ref <130)
Total CHOL/HDL Ratio: 2.8 (calc) (ref <5.0)
Triglycerides: 64 mg/dL (ref <150)

## 2023-09-10 LAB — LIPOPROTEIN A (LPA): Lipoprotein (a): 164 nmol/L — ABNORMAL HIGH (ref <75)

## 2023-09-14 ENCOUNTER — Other Ambulatory Visit: Payer: Self-pay | Admitting: Nurse Practitioner

## 2023-09-14 DIAGNOSIS — I10 Essential (primary) hypertension: Secondary | ICD-10-CM

## 2023-09-15 NOTE — Telephone Encounter (Signed)
Requested Prescriptions  Pending Prescriptions Disp Refills   amLODipine (NORVASC) 5 MG tablet [Pharmacy Med Name: AMLODIPINE BESYLATE 5 MG TAB] 90 tablet 1    Sig: TAKE 1 TABLET (5 MG TOTAL) BY MOUTH DAILY.     Cardiovascular: Calcium Channel Blockers 2 Failed - 09/15/2023  7:45 AM      Failed - Valid encounter within last 6 months    Recent Outpatient Visits           6 months ago Primary hypertension   [facility] Berniece Salines, FNP       Future Appointments             In 5 months Zane Herald Rudolpho Sevin, FNP [facility]            Passed - Last BP in normal range    BP Readings from Last 1 Encounters:  09/04/23 134/84         Passed - Last Heart Rate in normal range    Pulse Readings from Last 1 Encounters:  09/04/23 73

## 2023-12-07 ENCOUNTER — Encounter: Payer: Self-pay | Admitting: Nurse Practitioner

## 2023-12-07 ENCOUNTER — Ambulatory Visit: Payer: Self-pay

## 2023-12-07 ENCOUNTER — Ambulatory Visit (INDEPENDENT_AMBULATORY_CARE_PROVIDER_SITE_OTHER): Admitting: Nurse Practitioner

## 2023-12-07 VITALS — BP 126/82 | HR 78 | Temp 98.3°F | Resp 18 | Ht 69.0 in | Wt 216.2 lb

## 2023-12-07 DIAGNOSIS — J069 Acute upper respiratory infection, unspecified: Secondary | ICD-10-CM | POA: Diagnosis not present

## 2023-12-07 DIAGNOSIS — R062 Wheezing: Secondary | ICD-10-CM

## 2023-12-07 MED ORDER — BENZONATATE 100 MG PO CAPS: 200.0000 mg | ORAL_CAPSULE | Freq: Two times a day (BID) | ORAL | 0 refills | Status: DC | PRN

## 2023-12-07 MED ORDER — ALBUTEROL SULFATE HFA 108 (90 BASE) MCG/ACT IN AERS: 2.0000 | INHALATION_SPRAY | Freq: Four times a day (QID) | RESPIRATORY_TRACT | 0 refills | Status: DC | PRN

## 2023-12-07 MED ORDER — PREDNISONE 10 MG (21) PO TBPK
ORAL_TABLET | ORAL | 0 refills | Status: AC
Start: 2023-12-07 — End: ?

## 2023-12-07 MED ORDER — AZITHROMYCIN 250 MG PO TABS: ORAL_TABLET | ORAL | 0 refills | Status: AC

## 2023-12-07 NOTE — Telephone Encounter (Signed)
 Copied from CRM [id number]. Topic: Clinical - Red Word Triage >> Dec 07, 2023  8:10 AM Jayson Michael wrote: [facility] that prompted transfer to Nurse Triage: difficulty breathing  symptoms presented Wednesday started as a minor cough, then progressed to a productive cough. Chest congestion, difficulty breathing especially while lying down trying to sleep, runny nose and dark colored sputum. Patient denies fever, but states he is having cold chills.  Patient is requesting an acute appointment.   Chief Complaint: Cough  Symptoms: Chills, Cough, Congestion, Dyspnea, Scratchy Throat, Runny Nose, Chest Soreness  Frequency: Started on Wednesday  Pertinent Negatives: Patient denies fever Disposition: [] ED /[] Urgent Care (no appt availability in office) / [] Appointment(In office/virtual)/ []  [facility]/ [] Home Care/ [] Refused Recommended Disposition /[] [facility]/ []  Follow-up with PCP Additional Notes: TP is being triaged for a cough and accompanying symptoms. The patient reports having difficulty breathing when attempting to lie flat due to the congestion and coughing. Cough has been constant and productive,  initially sputum was clear, at this point, sputum is brownish-yellow colored. Due to symptom presentation in office appointment made for this morning.   Reason for Disposition  Coughing up rusty-colored (reddish-brown) sputum  [1] Continuous (nonstop) coughing interferes with work or school AND [2] no improvement using cough treatment per Care Advice  Answer Assessment - Initial Assessment Questions 1. ONSET: "When did the cough begin?"      On Wednesday   2. SEVERITY: "How bad is the cough today?"      Constant  3. SPUTUM: "Describe the color of your sputum" (none, dry cough; clear, white, yellow, green)     Brownish, Yellow  4. HEMOPTYSIS: "Are you coughing up any blood?" If so ask: "How much?" (flecks, streaks, tablespoons, etc.)     No  5. DIFFICULTY BREATHING:  "Are you having difficulty breathing?" If Yes, ask: "How bad is it?" (e.g., mild, moderate, severe)    - MILD: No SOB at rest, mild SOB with walking, speaks normally in sentences, can lie down, no retractions, pulse < 100.    - MODERATE: SOB at rest, SOB with minimal exertion and prefers to sit, cannot lie down flat, speaks in phrases, mild retractions, audible wheezing, pulse 100-120.    - SEVERE: Very SOB at rest, speaks in single words, struggling to breathe, sitting hunched forward, retractions, pulse > 120      Moderate  6. FEVER: "Do you have a fever?" If Yes, ask: "What is your temperature, how was it measured, and when did it start?"      No  7. CARDIAC HISTORY: "Do you have any history of heart disease?" (e.g., heart attack, congestive heart failure)      Hypertension  8. LUNG HISTORY: "Do you have any history of lung disease?"  (e.g., pulmonary embolus, asthma, emphysema)     No  9. PE RISK FACTORS: "Do you have a history of blood clots?" (or: recent major surgery, recent prolonged travel, bedridden)     No  10. OTHER SYMPTOMS: "Do you have any other symptoms?" (e.g., runny nose, wheezing, chest pain)      Runny Nose, Congestion, Chest Soreness, Sore Throat  12. TRAVEL: "Have you traveled out of the country in the last month?" (e.g., travel history, exposures)       Unsure  Protocols used: Cough - Acute Productive-A-AH

## 2023-12-07 NOTE — Progress Notes (Signed)
 BP 126/82   Pulse 78   Temp 98.3 F (36.8 C)   Resp 18   Ht 5\' 9"  (1.753 m)   Wt 216 lb 3.2 oz (98.1 kg)   SpO2 96%   BMI 31.93 kg/m    Subjective:    Patient ID: Cameron Freeman, male    DOB: 1980/10/21, 43 y.o.   MRN: [id number]  HPI: Cameron Freeman is a 43 y.o. male  Chief Complaint  Patient presents with   Cough    Onset 6 days, deep cough, sob, runny nose, cold chills and headache    Discussed the use of AI scribe software for clinical note transcription with the patient, who gave verbal consent to proceed.  History of Present Illness Cameron Freeman is a 43 year old male who presents with persistent cough and chest pain.  His symptoms began six days ago after attending a job orientation. Initially, he experienced drainage and a cough, which he thought would resolve on its own. However, the symptoms have progressively worsened, with the cough becoming more severe and painful, and he now feels it has settled in his chest.  The cough initially produced clear sputum, which has since changed color. He also reports difficulty breathing, chest pain, and a burning sensation in his chest when coughing. The pain is located in the front and back of his chest and is exacerbated by coughing. He notes that his nose has started burning, which was not present initially.  He used a nebulizer last night, which temporarily opened his airways for about twenty minutes, but his chest began to burn afterward. He also experienced wheezing. He has a history of smoking and previously used an inhaler for bronchitis.  No fever when he checked his temperature, although he was told he felt warm. He has been using Excedrin for headaches and drinking hot tea to soothe his throat. He considered taking Alka-Seltzer but was unsure about it. He has not been using any other medications regularly.  He is concerned about the potential impact of his symptoms on his children at home and is trying to determine  if the condition is allergy-related or requires antibiotics. He is also concerned about returning to work and managing his symptoms effectively.         09/04/2023    9:33 AM 03/04/2023   10:03 AM  Depression screen PHQ 2/9  Decreased Interest 0 0  Down, Depressed, Hopeless 0 0  PHQ - 2 Score 0 0  Altered sleeping 0   Tired, decreased energy 0   Change in appetite 0   Feeling bad or failure about yourself  0   Trouble concentrating 0   Moving slowly or fidgety/restless 0   Suicidal thoughts 0   PHQ-9 Score 0   Difficult doing work/chores Not difficult at all     Relevant past medical, surgical, family and social history reviewed and updated as indicated. Interim medical history since our last visit reviewed. Allergies and medications reviewed and updated.  Review of Systems  Ten systems reviewed and is negative except as mentioned in HPI      Objective:      BP 126/82   Pulse 78   Temp 98.3 F (36.8 C)   Resp 18   Ht 5\' 9"  (1.753 m)   Wt 216 lb 3.2 oz (98.1 kg)   SpO2 96%   BMI 31.93 kg/m    Wt Readings from Last 3 Encounters:  12/07/23 216 lb 3.2  oz (98.1 kg)  09/04/23 215 lb 12.8 oz (97.9 kg)  07/05/23 218 lb (98.9 kg)    Physical Exam Vitals reviewed.  Constitutional:      Appearance: Normal appearance.  HENT:     Head: Normocephalic.     Right Ear: Tympanic membrane normal.     Left Ear: Tympanic membrane normal.     Nose: Nose normal.     Mouth/Throat:     Mouth: Mucous membranes are moist.  Cardiovascular:     Rate and Rhythm: Normal rate and regular rhythm.  Pulmonary:     Effort: Pulmonary effort is normal.     Breath sounds: Normal breath sounds.  Musculoskeletal:        General: Normal range of motion.  Skin:    General: Skin is warm and dry.  Neurological:     General: No focal deficit present.     Mental Status: He is alert and oriented to person, place, and time. Mental status is at baseline.  Psychiatric:        Mood and Affect:  Mood normal.        Behavior: Behavior normal.        Thought Content: Thought content normal.        Judgment: Judgment normal.        Results for orders placed or performed in visit on 09/04/23  CBC with Differential/Platelet   Collection Time: 09/04/23 10:15 AM  Result Value Ref Range   WBC 6.0 3.8 - 10.8 Thousand/uL   RBC 4.88 4.20 - 5.80 Million/uL   Hemoglobin 14.6 13.2 - 17.1 g/dL   HCT 60.4 54.0 - 98.1 %   MCV 90.2 80.0 - 100.0 fL   MCH 29.9 27.0 - 33.0 pg   MCHC 33.2 32.0 - 36.0 g/dL   RDW 19.1 47.8 - 29.5 %   Platelets 221 140 - 400 Thousand/uL   MPV 10.1 7.5 - 12.5 fL   Neutro Abs 3,492 1,500 - 7,800 cells/uL   Absolute Lymphocytes 1,800 850 - 3,900 cells/uL   Absolute Monocytes 438 200 - 950 cells/uL   Eosinophils Absolute 258 15 - 500 cells/uL   Basophils Absolute 12 0 - 200 cells/uL   Neutrophils Relative % 58.2 %   Total Lymphocyte 30.0 %   Monocytes Relative 7.3 %   Eosinophils Relative 4.3 %   Basophils Relative 0.2 %  COMPLETE METABOLIC PANEL WITH GFR   Collection Time: 09/04/23 10:15 AM  Result Value Ref Range   Glucose, Bld 121 (H) 65 - 99 mg/dL   BUN 18 7 - 25 mg/dL   Creat 6.21 (H) 3.08 - 1.29 mg/dL   eGFR 68 > OR = 60 MV/HQI/6.96E9   BUN/Creatinine Ratio 13 6 - 22 (calc)   Sodium 140 135 - 146 mmol/L   Potassium 4.6 3.5 - 5.3 mmol/L   Chloride 108 98 - 110 mmol/L   CO2 26 20 - 32 mmol/L   Calcium 9.1 8.6 - 10.3 mg/dL   Total Protein 6.7 6.1 - 8.1 g/dL   Albumin 4.1 3.6 - 5.1 g/dL   Globulin 2.6 1.9 - 3.7 g/dL (calc)   AG Ratio 1.6 1.0 - 2.5 (calc)   Total Bilirubin 0.3 0.2 - 1.2 mg/dL   Alkaline phosphatase (APISO) 63 36 - 130 U/L   AST 18 10 - 40 U/L   ALT 17 9 - 46 U/L  Lipid panel   Collection Time: 09/04/23 10:15 AM  Result Value Ref Range   Cholesterol 166 <  200 mg/dL   HDL 60 > OR = 40 mg/dL   Triglycerides 64 <960 mg/dL   LDL Cholesterol (Calc) 91 mg/dL (calc)   Total CHOL/HDL Ratio 2.8 <5.0 (calc)   Non-HDL Cholesterol (Calc)  106 <130 mg/dL (calc)  Hemoglobin A5W   Collection Time: 09/04/23 10:15 AM  Result Value Ref Range   Hgb A1c MFr Bld 5.9 (H) <5.7 % of total Hgb   Mean Plasma Glucose 123 mg/dL   eAG (mmol/L) 6.8 mmol/L  Lipoprotein A (LPA)   Collection Time: 09/04/23 10:15 AM  Result Value Ref Range   Lipoprotein (a) 164 (H) <75 nmol/L          Assessment & Plan:   Problem List Items Addressed This Visit   None Visit Diagnoses       Viral upper respiratory tract infection    -  Primary   Relevant Medications   albuterol (VENTOLIN HFA) 108 (90 Base) MCG/ACT inhaler   predniSONE  (STERAPRED UNI-PAK 21 TAB) 10 MG (21) TBPK tablet   azithromycin (ZITHROMAX) 250 MG tablet   benzonatate  (TESSALON ) 100 MG capsule     Wheezing       Relevant Medications   albuterol (VENTOLIN HFA) 108 (90 Base) MCG/ACT inhaler   predniSONE  (STERAPRED UNI-PAK 21 TAB) 10 MG (21) TBPK tablet   azithromycin (ZITHROMAX) 250 MG tablet        Assessment and Plan Assessment & Plan Acute bronchitis Acute bronchitis with symptoms persisting for six days, including cough, wheezing, and chest discomfort. Cough has progressed from clear to colored sputum, indicating possible bacterial involvement. No fever reported. Differential diagnosis includes pneumonia, but no auscultatory evidence of pneumonia was found. Nebulizer treatment provided temporary relief. Smoking and previous bronchitis noted. Informed consent provided regarding the use of steroids, antibiotics, and inhalers, including potential side effects and expected outcomes. Discussed that cough medicine may help suppress cough but will not eliminate it entirely. - Prescribe a steroid to reduce inflammation and open airways. - Prescribe antibiotics to cover potential bacterial infection. - Prescribe an inhaler for use at home. - Advise continued use of nebulizer if available. - Prescribe Tessalon  Perles to help manage cough symptoms. - Advise increased fluid intake to  prevent dehydration. - Recommend avoiding dairy to reduce mucus production.  Costochondritis Costochondritis secondary to acute bronchitis, causing chest pain due to inflammation between the ribs. Pain exacerbated by coughing. Steroid treatment expected to alleviate inflammation and pain. - Prescribe a steroid to reduce inflammation and alleviate chest pain.        Follow up plan: Return if symptoms worsen or fail to improve.

## 2023-12-29 ENCOUNTER — Other Ambulatory Visit: Payer: Self-pay | Admitting: Nurse Practitioner

## 2023-12-29 DIAGNOSIS — R062 Wheezing: Secondary | ICD-10-CM

## 2023-12-29 DIAGNOSIS — J069 Acute upper respiratory infection, unspecified: Secondary | ICD-10-CM

## 2023-12-29 NOTE — Telephone Encounter (Signed)
 Requested medications are due for refill today.  unsure  Requested medications are on the active medications list.  yes  Last refill. 12/07/2023 8.5 0 rf  Future visit scheduled.   yes  Notes to clinic.  Medication was prescribed for URI. New medication to this pt. Unsure if this is a maintenance medication.    Requested Prescriptions  Pending Prescriptions Disp Refills   albuterol  (VENTOLIN  HFA) 108 (90 Base) MCG/ACT inhaler [Pharmacy Med Name: ALBUTEROL  HFA (PROAIR ) INHALER] 8.5 each     Sig: TAKE 2 PUFFS BY MOUTH EVERY 6 HOURS AS NEEDED FOR WHEEZE OR SHORTNESS OF BREATH     Pulmonology:  Beta Agonists 2 Passed - 12/29/2023  4:47 PM      Passed - Last BP in normal range    BP Readings from Last 1 Encounters:  12/07/23 126/82         Passed - Last Heart Rate in normal range    Pulse Readings from Last 1 Encounters:  12/07/23 78         Passed - Valid encounter within last 12 months    Recent Outpatient Visits           3 weeks ago Viral upper respiratory tract infection   [facility] [place] [facility] Quinton Buckler, FNP   3 months ago Primary hypertension   [facility] Quinton Buckler, FNP       Future Appointments             In 2 weeks Lawerence Pressman, MD [facility] [place]   In 2 months Abram Hoguet, Monalisa Angles, FNP [facility]

## 2024-01-13 ENCOUNTER — Ambulatory Visit (INDEPENDENT_AMBULATORY_CARE_PROVIDER_SITE_OTHER): Admitting: Urology

## 2024-01-13 ENCOUNTER — Encounter: Payer: Self-pay | Admitting: Urology

## 2024-01-13 VITALS — BP 161/110 | HR 68 | Ht 69.0 in | Wt 223.0 lb

## 2024-01-13 DIAGNOSIS — Z3009 Encounter for other general counseling and advice on contraception: Secondary | ICD-10-CM

## 2024-01-13 DIAGNOSIS — N200 Calculus of kidney: Secondary | ICD-10-CM | POA: Diagnosis not present

## 2024-01-13 MED ORDER — DIAZEPAM 10 MG PO TABS: 10.0000 mg | ORAL_TABLET | Freq: Once | ORAL | 0 refills | Status: AC | PRN

## 2024-01-13 NOTE — Patient Instructions (Signed)

## 2024-01-13 NOTE — Progress Notes (Signed)
 01/13/24 9:39 AM   Fransisca Jabs Haggart Feb 02, 1981 [id number]  CC: Discuss vasectomy, history of kidney stones  HPI: 43 year old male interested in vasectomy for permanent sterilization.  He also has a history of nephrolithiasis previously requiring shockwave lithotripsy and ureteroscopy.  They have 5 children and do not desire any further biologic pregnancies.  Overall has done well from a kidney stone perspective with diet changes, reports 1 stone in the last 5 years.  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   PMH: Past Medical History:  Diagnosis Date   Hypertension    Kidney stones    Microscopic hematuria     Surgical History: Past Surgical History:  Procedure Laterality Date   CYSTOSCOPY WITH STENT PLACEMENT Right 06/14/2018   Procedure: CYSTOSCOPY WITH STENT PLACEMENT;  Surgeon: Dustin Gimenez, MD;  Location: [facility] ORS;  Service: Urology;  Laterality: Right;   EXTRACORPOREAL SHOCK WAVE LITHOTRIPSY Left 09/20/2015   Procedure: EXTRACORPOREAL SHOCK WAVE LITHOTRIPSY (ESWL);  Surgeon: Bart Born, MD;  Location: [facility] ORS;  Service: Urology;  Laterality: Left;   NO PAST SURGERIES     none     URETEROSCOPY WITH HOLMIUM LASER LITHOTRIPSY Right 06/14/2018   Procedure: URETEROSCOPY WITH HOLMIUM LASER LITHOTRIPSY;  Surgeon: Dustin Gimenez, MD;  Location: [facility] ORS;  Service: Urology;  Laterality: Right;   WRIST SURGERY  03/06/2022    Family History: Family History  Problem Relation Age of Onset   Kidney disease Maternal Aunt        dialysis   Lung cancer Father    Kidney disease Mother        one kidney   Kidney Stones Other    Prostate cancer Neg Hx     Social History:  reports that he has quit smoking. His smoking use included cigars. He has never been exposed to tobacco smoke. He has never used smokeless tobacco. He reports that he does not currently use alcohol. He reports that he does not currently use  drugs after having used the following drugs: Marijuana.  Physical Exam: BP (!) 161/110 (BP Location: Left Arm, Patient Position: Sitting, Cuff Size: Large)   Pulse 68   Ht 5' 9 (1.753 m)   Wt 223 lb (101.2 kg)   SpO2 98%   BMI 32.93 kg/m    Constitutional:  Alert and oriented, No acute distress. Cardiovascular: No clubbing, cyanosis, or edema. Respiratory: Normal respiratory effort, no increased work of breathing. GI: Abdomen is soft, nontender, nondistended, no abdominal masses   Assessment & Plan:   43 year old male with history of recurrent nephrolithiasis, interested in vasectomy for permanent sterilization.  We discussed general stone prevention strategies including adequate hydration with goal of producing 2.5 L of urine daily, increasing citric acid intake, increasing calcium intake during high oxalate meals, minimizing animal protein, and decreasing salt intake. Information about dietary recommendations given today.   We discussed the risks and benefits of vasectomy at length.  Vasectomy is intended to be a permanent form of contraception, and does not produce immediate sterility.  Following vasectomy another form of contraception is required until vas occlusion is confirmed by a post-vasectomy semen analysis obtained 2-3 months after the procedure.  Even after vas occlusion is confirmed, vasectomy is not 100% reliable in preventing pregnancy, and the failure rate is approximately 07/1998.  Repeat vasectomy is required in less than 1% of patients.  He should refrain from ejaculation for 1 week after vasectomy.  Options for fertility after vasectomy  include vasectomy reversal, and sperm retrieval with in vitro fertilization or ICSI.  These options are not always successful and may be expensive.  Finally, there are other permanent and non-permanent alternatives to vasectomy available. There is no risk of erectile dysfunction, and the volume of semen will be similar to prior, as the  majority of the ejaculate is from the prostate and seminal vesicles.   The procedure takes ~20 minutes.  We recommend patients take 5-10 mg of Valium  30 minutes prior, and he will need a driver post-procedure.  Local anesthetic is injected into the scrotal skin and a small segment of the vas deferens is removed, and the ends occluded. The complication rate is approximately 1-2%, and includes bleeding, infection, and development of chronic scrotal pain.  PLAN: Schedule vasectomy with valium   Jay Meth, MD 01/13/2024  [facility] [address], [address] [place], [place] [address] [phone]

## 2024-02-18 ENCOUNTER — Encounter: Admitting: Urology

## 2024-03-03 ENCOUNTER — Encounter: Admitting: Urology

## 2024-03-03 ENCOUNTER — Ambulatory Visit: Payer: 59 | Admitting: Nurse Practitioner

## 2024-03-04 ENCOUNTER — Ambulatory Visit: Admitting: Nurse Practitioner

## 2024-03-04 NOTE — Progress Notes (Deleted)
 There were no vitals taken for this visit.   Subjective:    Patient ID: Cameron Freeman, male    DOB: 24-Mar-1981, 43 y.o.   MRN: [id number]  HPI: Cameron Freeman is a 43 y.o. male  No chief complaint on file.   Discussed the use of AI scribe software for clinical note transcription with the patient, who gave verbal consent to proceed.  History of Present Illness      Waist Measurement : (not recorded)  There is no height or weight on file to calculate BMI.  There were no vitals filed for this visit.     09/04/2023    9:33 AM 03/04/2023   10:03 AM  Depression screen PHQ 2/9  Decreased Interest 0 0  Down, Depressed, Hopeless 0 0  PHQ - 2 Score 0 0  Altered sleeping 0   Tired, decreased energy 0   Change in appetite 0   Feeling bad or failure about yourself  0   Trouble concentrating 0   Moving slowly or fidgety/restless 0   Suicidal thoughts 0   PHQ-9 Score 0   Difficult doing work/chores Not difficult at all     Relevant past medical, surgical, family and social history reviewed and updated as indicated. Interim medical history since our last visit reviewed. Allergies and medications reviewed and updated.  Review of Systems  Constitutional: Negative for fever or weight change.  Respiratory: Negative for cough and shortness of breath.   Cardiovascular: Negative for chest pain or palpitations.  Gastrointestinal: Negative for abdominal pain, no bowel changes.  Musculoskeletal: Negative for gait problem or joint swelling.  Skin: Negative for rash.  Neurological: Negative for dizziness or headache.  No other specific complaints in a complete review of systems (except as listed in HPI above).      Objective:     There were no vitals taken for this visit.  {Vitals History (Optional):23777} Wt Readings from Last 3 Encounters:  01/13/24 223 lb (101.2 kg)  12/07/23 216 lb 3.2 oz (98.1 kg)  09/04/23 215 lb 12.8 oz (97.9 kg)    Physical Exam Physical Exam     Results for orders placed or performed in visit on 09/04/23  CBC with Differential/Platelet   Collection Time: 09/04/23 10:15 AM  Result Value Ref Range   WBC 6.0 3.8 - 10.8 Thousand/uL   RBC 4.88 4.20 - 5.80 Million/uL   Hemoglobin 14.6 13.2 - 17.1 g/dL   HCT 55.9 61.4 - 49.9 %   MCV 90.2 80.0 - 100.0 fL   MCH 29.9 27.0 - 33.0 pg   MCHC 33.2 32.0 - 36.0 g/dL   RDW 87.9 88.9 - 84.9 %   Platelets 221 140 - 400 Thousand/uL   MPV 10.1 7.5 - 12.5 fL   Neutro Abs 3,492 1,500 - 7,800 cells/uL   Absolute Lymphocytes 1,800 850 - 3,900 cells/uL   Absolute Monocytes 438 200 - 950 cells/uL   Eosinophils Absolute 258 15 - 500 cells/uL   Basophils Absolute 12 0 - 200 cells/uL   Neutrophils Relative % 58.2 %   Total Lymphocyte 30.0 %   Monocytes Relative 7.3 %   Eosinophils Relative 4.3 %   Basophils Relative 0.2 %  COMPLETE METABOLIC PANEL WITH GFR   Collection Time: 09/04/23 10:15 AM  Result Value Ref Range   Glucose, Bld 121 (H) 65 - 99 mg/dL   BUN 18 7 - 25 mg/dL   Creat 8.64 (H) 9.39 - 1.29 mg/dL   eGFR  68 > OR = 60 mL/min/1.10m2   BUN/Creatinine Ratio 13 6 - 22 (calc)   Sodium 140 135 - 146 mmol/L   Potassium 4.6 3.5 - 5.3 mmol/L   Chloride 108 98 - 110 mmol/L   CO2 26 20 - 32 mmol/L   Calcium 9.1 8.6 - 10.3 mg/dL   Total Protein 6.7 6.1 - 8.1 g/dL   Albumin 4.1 3.6 - 5.1 g/dL   Globulin 2.6 1.9 - 3.7 g/dL (calc)   AG Ratio 1.6 1.0 - 2.5 (calc)   Total Bilirubin 0.3 0.2 - 1.2 mg/dL   Alkaline phosphatase (APISO) 63 36 - 130 U/L   AST 18 10 - 40 U/L   ALT 17 9 - 46 U/L  Lipid panel   Collection Time: 09/04/23 10:15 AM  Result Value Ref Range   Cholesterol 166 <200 mg/dL   HDL 60 > OR = 40 mg/dL   Triglycerides 64 <849 mg/dL   LDL Cholesterol (Calc) 91 mg/dL (calc)   Total CHOL/HDL Ratio 2.8 <5.0 (calc)   Non-HDL Cholesterol (Calc) 106 <130 mg/dL (calc)  Hemoglobin J8r   Collection Time: 09/04/23 10:15 AM  Result Value Ref Range   Hgb A1c MFr Bld 5.9 (H) <5.7 % of  total Hgb   Mean Plasma Glucose 123 mg/dL   eAG (mmol/L) 6.8 mmol/L  Lipoprotein A (LPA)   Collection Time: 09/04/23 10:15 AM  Result Value Ref Range   Lipoprotein (a) 164 (H) <75 nmol/L   {Labs (Optional):23779}       Assessment & Plan:   Problem List Items Addressed This Visit       Cardiovascular and Mediastinum   Primary hypertension - Primary     Respiratory   Allergic rhinitis     Musculoskeletal and Integument   Intrinsic eczema     Other   Class 1 obesity due to excess calories with serious comorbidity and body mass index (BMI) of 31.0 to 31.9 in adult   Hyperlipidemia     Assessment and Plan Assessment & Plan         Follow up plan: No follow-ups on file.

## 2024-03-10 ENCOUNTER — Ambulatory Visit: Admitting: Nurse Practitioner

## 2024-03-14 ENCOUNTER — Ambulatory Visit: Admit: 2024-03-14 | Admitting: Urology

## 2024-03-14 SURGERY — ENUCLEATION, PROSTATE, USING LASER, WITH MORCELLATION
Anesthesia: General

## 2024-03-23 ENCOUNTER — Other Ambulatory Visit: Payer: Self-pay | Admitting: Nurse Practitioner

## 2024-03-23 DIAGNOSIS — I10 Essential (primary) hypertension: Secondary | ICD-10-CM

## 2024-03-24 NOTE — Telephone Encounter (Signed)
 Courtesy refill. Patient will need an office visit for additional refills.  Requested Prescriptions  Pending Prescriptions Disp Refills   amLODipine  (NORVASC ) 5 MG tablet [Pharmacy Med Name: AMLODIPINE  BESYLATE 5 MG TAB] 30 tablet 0    Sig: TAKE 1 TABLET (5 MG TOTAL) BY MOUTH DAILY.     Cardiovascular: Calcium Channel Blockers 2 Failed - 03/24/2024  1:28 PM      Failed - Last BP in normal range    BP Readings from Last 1 Encounters:  01/13/24 (!) 161/110         Failed - Valid encounter within last 6 months    Recent Outpatient Visits           3 months ago Viral upper respiratory tract infection   [facility] Gareth Mliss F[place], FNP   6 months ago Primary hypertension   [facility] Gareth Mliss [place], [place]              Passed - Last Heart Rate in normal range    Pulse Readings from Last 1 Encounters:  01/13/24 68

## 2024-04-16 ENCOUNTER — Other Ambulatory Visit: Payer: Self-pay | Admitting: Nurse Practitioner

## 2024-04-16 DIAGNOSIS — I10 Essential (primary) hypertension: Secondary | ICD-10-CM

## 2024-04-18 NOTE — Telephone Encounter (Signed)
 Requested medication (s) are due for refill today: yes  Requested medication (s) are on the active medication list: yes  Last refill:  03/24/24  Future visit scheduled: no  Notes to clinic:  Unable to refill per protocol, courtesy refill already given, routing for provider approval.      Requested Prescriptions  Pending Prescriptions Disp Refills   amLODipine  (NORVASC ) 5 MG tablet [Pharmacy Med Name: AMLODIPINE  BESYLATE 5 MG TAB] 90 tablet 1    Sig: TAKE 1 TABLET (5 MG TOTAL) BY MOUTH DAILY.     Cardiovascular: Calcium Channel Blockers 2 Failed - 04/18/2024  2:27 PM      Failed - Last BP in normal range    BP Readings from Last 1 Encounters:  01/13/24 (!) 161/110         Failed - Valid encounter within last 6 months    Recent Outpatient Visits           4 months ago Viral upper respiratory tract infection   [facility] Gareth Mliss FAL[place], FNP   7 months ago Primary hypertension   [facility] Gareth Mliss [place], [place]              Passed - Last Heart Rate in normal range    Pulse Readings from Last 1 Encounters:  01/13/24 68

## 2024-04-20 ENCOUNTER — Other Ambulatory Visit: Payer: Self-pay | Admitting: Nurse Practitioner

## 2024-04-20 DIAGNOSIS — I10 Essential (primary) hypertension: Secondary | ICD-10-CM

## 2024-04-21 NOTE — Telephone Encounter (Signed)
 Courtesy refill given, appointment needed.   Requested Prescriptions  Pending Prescriptions Disp Refills   amLODipine  (NORVASC ) 5 MG tablet [Pharmacy Med Name: AMLODIPINE  BESYLATE 5 MG TAB] 30 tablet 0    Sig: TAKE 1 TABLET (5 MG TOTAL) BY MOUTH DAILY.     Cardiovascular: Calcium Channel Blockers 2 Failed - 04/21/2024 12:55 PM      Failed - Last BP in normal range    BP Readings from Last 1 Encounters:  01/13/24 (!) 161/110         Failed - Valid encounter within last 6 months    Recent Outpatient Visits           4 months ago Viral upper respiratory tract infection   [facility] Gareth Mliss FALCON, FNP   7 months ago Primary hypertension   [facility] Gareth Mliss [place], [place]              Passed - Last Heart Rate in normal range    Pulse Readings from Last 1 Encounters:  01/13/24 68

## 2024-09-09 ENCOUNTER — Encounter: Admitting: Nurse Practitioner
